# Patient Record
Sex: Female | Born: 1985 | Race: Black or African American | Hispanic: No | Marital: Single | Smoking: Never smoker
Health system: Southern US, Community
[De-identification: ages and names within clinical notes are randomized; demographics above are authoritative.]

## PROBLEM LIST (undated history)

## (undated) DIAGNOSIS — K449 Diaphragmatic hernia without obstruction or gangrene: Secondary | ICD-10-CM

---

## 2011-07-23 ENCOUNTER — Encounter (HOSPITAL_COMMUNITY): Payer: Self-pay

## 2011-07-23 ENCOUNTER — Emergency Department (HOSPITAL_COMMUNITY)
Admission: EM | Admit: 2011-07-23 | Discharge: 2011-07-24 | Disposition: A | Discharge status: Home / Self Care | Payer: Self-pay | Attending: Emergency Medicine | Admitting: Emergency Medicine

## 2011-07-23 DIAGNOSIS — L089 Local infection of the skin and subcutaneous tissue, unspecified: Secondary | ICD-10-CM | POA: Insufficient documentation

## 2011-07-23 DIAGNOSIS — R21 Rash and other nonspecific skin eruption: Secondary | ICD-10-CM | POA: Insufficient documentation

## 2011-07-23 DIAGNOSIS — R238 Other skin changes: Secondary | ICD-10-CM

## 2011-07-23 MED ORDER — OXYCODONE-ACETAMINOPHEN 5-325 MG PO TABS
1.0000 | ORAL_TABLET | Freq: Once | ORAL | Status: DC
  Filled 2011-07-23: qty 1

## 2011-07-23 NOTE — ED Notes (Signed)
States she had a rash that oozes at times to her scalp x 3 weeks-states it flakes like dandruff.  Has been using head and shoulders and other OTC treatments w/o relief.

## 2011-07-23 NOTE — ED Provider Notes (Signed)
History     CSN: [id number]  Arrival date & time 07/23/11  2055   First MD Initiated Contact with Patient 07/23/11 2300      Chief Complaint  Patient presents with  . Rash    (Consider location/radiation/quality/duration/timing/severity/associated sxs/prior treatment) HPI  26 year old female presents complaining of a rash to the scalp. States noticed a rash on the scalp the past 3 weeks. Rash is described as itchy, persistent, with flakes like dandruff.  Has also notice burning lesion that sometimes ooze when scratched.  Pt admits to occasionally using synthetic hair, weaves, but has never had this before.  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  Pt has tried different types of shampoo without relief.  Denies fever, chills, headache, neck pain, or other body rash.  No other family member has same rash.  NO hx of psoriasis.    History reviewed. No pertinent past medical history.  History reviewed. No pertinent past surgical history.  History reviewed. No pertinent family history.  History  Substance Use Topics  . Smoking status: Never Smoker   . Smokeless tobacco: Not on file  . Alcohol Use: No    OB History    Grav Para Term Preterm Abortions TAB SAB Ect Mult Living                  Review of Systems  All other systems reviewed and are negative.    Allergies  Review of patient's allergies indicates no known allergies.  Home Medications  No current outpatient prescriptions on file.  BP 118/71  Pulse 76  Temp 98.2 F (36.8 C) (Oral)  Resp 16  SpO2 97%  LMP 06/25/2011  Physical Exam  Nursing note and vitals reviewed. Constitutional: She is oriented to person, place, and time. She appears well-nourished.  HENT:  Head: Normocephalic.  Mouth/Throat: Oropharynx is clear and moist. No oropharyngeal exudate.       Hyperkeratotic skin changes to scalp throughout hairline, and extending down to lateral aspect of face.  Several vesicular lesions (kerion) noted to scalp  in various distribution.  No abscess noted.  No evidence of headlice.    Eyes: Conjunctivae are normal. Right eye exhibits no discharge. Left eye exhibits no discharge.  Neck: Neck supple.  Cardiovascular: Normal rate and regular rhythm.   Pulmonary/Chest: Effort normal. No respiratory distress. She has no wheezes.  Musculoskeletal: Normal range of motion.  Lymphadenopathy:    She has no cervical adenopathy.  Neurological: She is alert and oriented to person, place, and time.  Skin: Skin is warm. Rash noted.    ED Course  Procedures (including critical care time)  Labs Reviewed - No data to display No results found.   No diagnosis found.  1. Scalp rash 2. Secondary skin infection  MDM  Rash on scalp x 3 weeks.  Sxs is not suggestive  of tinea capitis.  Likely atopic dermatitis with superficial skin infection.  Plan to treat with clindamycin and selenium sulfide shampoo and f/u with dermatologist .  My attending has also eveluated pt.     Fayrene Helper, PA-C 07/23/11 2355

## 2011-07-24 MED ORDER — SELENIUM SULFIDE 2.25 % EX FOAM: CUTANEOUS | Status: DC

## 2011-07-24 MED ORDER — CLINDAMYCIN HCL 150 MG PO CAPS: 150.0000 mg | ORAL_CAPSULE | Freq: Four times a day (QID) | ORAL | Status: AC

## 2011-07-24 NOTE — ED Provider Notes (Signed)
Medical screening examination/treatment/procedure(s) were performed by non-physician practitioner and as supervising physician I was immediately available for consultation/collaboration.  Gerhard Munch, MD 07/24/11 2136

## 2012-01-28 ENCOUNTER — Emergency Department (HOSPITAL_BASED_OUTPATIENT_CLINIC_OR_DEPARTMENT_OTHER)
Admission: EM | Admit: 2012-01-28 | Discharge: 2012-01-28 | Disposition: A | Discharge status: Home / Self Care | Payer: Self-pay | Attending: Emergency Medicine | Admitting: Emergency Medicine

## 2012-01-28 ENCOUNTER — Encounter (HOSPITAL_BASED_OUTPATIENT_CLINIC_OR_DEPARTMENT_OTHER): Payer: Self-pay | Admitting: Emergency Medicine

## 2012-01-28 DIAGNOSIS — R05 Cough: Secondary | ICD-10-CM | POA: Insufficient documentation

## 2012-01-28 DIAGNOSIS — J111 Influenza due to unidentified influenza virus with other respiratory manifestations: Secondary | ICD-10-CM | POA: Insufficient documentation

## 2012-01-28 DIAGNOSIS — R509 Fever, unspecified: Secondary | ICD-10-CM | POA: Insufficient documentation

## 2012-01-28 DIAGNOSIS — R059 Cough, unspecified: Secondary | ICD-10-CM | POA: Insufficient documentation

## 2012-01-28 MED ORDER — IBUPROFEN 800 MG PO TABS: 800.0000 mg | ORAL_TABLET | Freq: Three times a day (TID) | ORAL | Status: DC

## 2012-01-28 MED ORDER — ONDANSETRON HCL 8 MG PO TABS
4.0000 mg | ORAL_TABLET | Freq: Once | ORAL | Status: AC
  Administered 2012-01-28: 4 mg via ORAL
  Filled 2012-01-28: qty 1

## 2012-01-28 MED ORDER — PROMETHAZINE HCL 25 MG RE SUPP: 25.0000 mg | Freq: Four times a day (QID) | RECTAL | Status: DC | PRN

## 2012-01-28 MED ORDER — GUAIFENESIN-CODEINE 100-10 MG/5ML PO SYRP: 5.0000 mL | ORAL_SOLUTION | Freq: Three times a day (TID) | ORAL | Status: DC | PRN

## 2012-01-28 MED ORDER — KETOROLAC TROMETHAMINE 60 MG/2ML IM SOLN
60.0000 mg | Freq: Once | INTRAMUSCULAR | Status: AC
  Administered 2012-01-28: 60 mg via INTRAMUSCULAR
  Filled 2012-01-28: qty 2

## 2012-01-28 NOTE — ED Notes (Signed)
Pt with cough, fever, chills, and body aches for 2 days. Left work early due to cough.

## 2012-01-28 NOTE — ED Provider Notes (Signed)
History     CSN: [id number]  Arrival date & time 01/28/12  1840   First MD Initiated Contact with Patient 01/28/12 1852      Chief Complaint  Patient presents with  . Influenza    (Consider location/radiation/quality/duration/timing/severity/associated sxs/prior treatment) HPI Comments: Patient comes to the ER with complaints of fever, chills, sinus congestion, body aches and cough for 2 days. She has had nausea and vomiting without diarrhea as well. She has been able to keep some soup today. Patient reports that she had to leave work early today due to illness, needs a note.  Patient is a 27 y.o. female presenting with flu symptoms.  Influenza    History reviewed. No pertinent past medical history.  History reviewed. No pertinent past surgical history.  No family history on file.  History  Substance Use Topics  . Smoking status: Never Smoker   . Smokeless tobacco: Not on file  . Alcohol Use: No    OB History    Grav Para Term Preterm Abortions TAB SAB Ect Mult Living                  Review of Systems  Constitutional: Positive for fever and chills.  HENT: Positive for congestion.   Respiratory: Positive for cough.   Gastrointestinal: Positive for nausea and vomiting. Negative for diarrhea.    Allergies  Review of patient's allergies indicates no known allergies.  Home Medications   Current Outpatient Rx  Name  Route  Sig  Dispense  Refill  . SELENIUM SULFIDE 2.25 % EX FOAM      Apply to scalp twice daily.  Rub into affected skin until completely absorbed.  Continues until symptoms resolved.   70 g   0     BP 101/60  Pulse 115  Temp 100.5 F (38.1 C) (Oral)  Resp 20  SpO2 95%  LMP 01/11/2012  Physical Exam  Constitutional: She is oriented to person, place, and time. She appears well-developed and well-nourished.  HENT:  Head: Normocephalic.  Nose: Mucosal edema present.  Mouth/Throat: Oropharynx is clear and moist and mucous membranes are  normal. No oropharyngeal exudate.  Eyes: Pupils are equal, round, and reactive to light.  Neck: Normal range of motion. Neck supple.  Cardiovascular: Normal rate, regular rhythm and normal heart sounds.   Pulmonary/Chest: Breath sounds normal. She is in respiratory distress. She has no wheezes. She has no rales. She exhibits no tenderness.  Abdominal: Soft. She exhibits no distension and no mass. There is no tenderness.  Musculoskeletal: Normal range of motion. She exhibits no edema.  Neurological: She is alert and oriented to person, place, and time. She has normal reflexes. No cranial nerve deficit.  Skin: Skin is warm and dry.  Psychiatric: She has a normal mood and affect.    ED Course  Procedures (including critical care time)  Labs Reviewed - No data to display No results found.   Diagnosis: Influenza    MDM  Patient presents with symptoms consistent with the prevalent influenza in the community currently. She will be treated symptomatically. Examination was unremarkable. Lungs are clear. No clinical concern for pneumonia based on auscultation. Return to the ER if symptoms worsen.        Gilda Crease, MD 01/28/12 [phone]

## 2012-11-06 ENCOUNTER — Encounter (HOSPITAL_COMMUNITY): Payer: Self-pay | Admitting: Emergency Medicine

## 2012-11-06 ENCOUNTER — Emergency Department (HOSPITAL_COMMUNITY)
Admission: EM | Admit: 2012-11-06 | Discharge: 2012-11-06 | Disposition: A | Discharge status: Home / Self Care | Payer: Self-pay | Attending: Dermatology | Admitting: Dermatology

## 2012-11-06 DIAGNOSIS — Z791 Long term (current) use of non-steroidal anti-inflammatories (NSAID): Secondary | ICD-10-CM | POA: Insufficient documentation

## 2012-11-06 DIAGNOSIS — L259 Unspecified contact dermatitis, unspecified cause: Secondary | ICD-10-CM | POA: Insufficient documentation

## 2012-11-06 MED ORDER — TRIAMCINOLONE ACETONIDE 0.1 % EX CREA: TOPICAL_CREAM | Freq: Two times a day (BID) | CUTANEOUS | Status: DC

## 2012-11-06 NOTE — ED Provider Notes (Signed)
CSN: [id number]     Arrival date & time 11/06/12  1859 History   This chart was scribed for non-physician practitioner Kyung Bacca working with No att. providers found by Joaquin Music, ED Scribe. This patient was seen in room WTR5/WTR5 and the patient's care was started at 8:09 PM .   Chief Complaint  Patient presents with  . Rash    The history is provided by the patient. No language interpreter was used.   HPI Comments: Martha Brady is a 27 y.o. female who presents to the Emergency Department complaining of ongoing worsening, severely pruritic rash on bilateral upper extremities. Pt states the rash has been worsening although she has been applying hydrocortisone. She states she scratches the rash and has noticed circular patches on her arms. Pt states the rash feels like it "burns" now. Pt denies discharge of rash. Pt denies rash being in any other location of her body. Pt denies ever having a hx of this rash. She states she has had allergic reactions before but does not believe this is an allergic reaction. Pt denies yard work, coming in contact with poison ivy, change of detergents or shampoos, and change of fragrances. Pt states she wakes up in her sleep due to the burning/itching sensation. Pt denies having pets in her house. Pt denies fever or other associated infectious sx.   History reviewed. No pertinent past medical history. History reviewed. No pertinent past surgical history. No family history on file. History  Substance Use Topics  . Smoking status: Never Smoker   . Smokeless tobacco: Not on file  . Alcohol Use: No   OB History   Grav Para Term Preterm Abortions TAB SAB Ect Mult Living                 Review of Systems  All other systems reviewed and are negative.   Allergies  Review of patient's allergies indicates no known allergies.  Home Medications   Current Outpatient Rx  Name  Route  Sig  Dispense  Refill  . guaiFENesin-codeine  (ROBITUSSIN AC) 100-10 MG/5ML syrup   Oral   Take 5 mLs by mouth 3 (three) times daily as needed for cough.   120 mL   0   . ibuprofen (ADVIL,MOTRIN) 800 MG tablet   Oral   Take 1 tablet (800 mg total) by mouth 3 (three) times daily.   21 tablet   0   . promethazine (PHENERGAN) 25 MG suppository   Rectal   Place 1 suppository (25 mg total) rectally every 6 (six) hours as needed for nausea.   12 each   0   . Selenium Sulfide 2.25 % FOAM      Apply to scalp twice daily.  Rub into affected skin until completely absorbed.  Continues until symptoms resolved.   70 g   0    Triage Vitals:BP 125/76  Pulse 72  Temp(Src) 98.6 F (37 C) (Oral)  Resp 18  SpO2 99%  LMP 10/30/2012  Physical Exam  Nursing note and vitals reviewed. Constitutional: She is oriented to person, place, and time. She appears well-developed and well-nourished. No distress.  HENT:  Head: Normocephalic and atraumatic.  Eyes: EOM are normal.  Neck: Neck supple. No tracheal deviation present.  Cardiovascular: Normal rate.   Pulmonary/Chest: Effort normal. No respiratory distress.  Musculoskeletal: Normal range of motion.  Neurological: She is alert and oriented to person, place, and time.  Skin: Skin is warm and dry.  Several discrete, 2-32mm  skin colored papules in clustered rings on bilateral forearms, both flexor and extensor surfaces, from wrists to just proximal to elbow.  Pt scratching.    Psychiatric: She has a normal mood and affect. Her behavior is normal.    ED Course  Procedures  Labs Review Labs Reviewed - No data to display Imaging Review No results found.  EKG Interpretation   None       MDM   1. Contact dermatitis    Healthy 27yo F presents w/ pruritic rash of bilateral forearms.  Has the appearance of contact dermatitis.  Irritant unknown.  She has used hydrocortisone w/out relief.  Will try a higher potency steroid.  Recommended moisturizers, gentle soap, benadryl, cool  compresses, avoidance of scratching.  Referred to primary care and derm.  8:46 PM   I personally performed the services described in this documentation, which was scribed in my presence. The recorded information has been reviewed and is accurate.    Otilio Miu, PA-C 11/06/12 2050

## 2012-11-06 NOTE — ED Notes (Addendum)
Pt c/o rash on both upper extremities that burns denies any other places.. Denies any changes in detergent or any other things that could affect arms. NKA.

## 2012-11-07 NOTE — ED Provider Notes (Signed)
Medical screening examination/treatment/procedure(s) were performed by non-physician practitioner and as supervising physician I was immediately available for consultation/collaboration.  EKG Interpretation   None         Eniyah Eastmond E Eyvette Cordon, MD 11/07/12 1244 

## 2013-12-03 ENCOUNTER — Ambulatory Visit (HOSPITAL_BASED_OUTPATIENT_CLINIC_OR_DEPARTMENT_OTHER): Payer: Self-pay

## 2013-12-03 ENCOUNTER — Ambulatory Visit: Payer: Self-pay | Attending: Internal Medicine | Admitting: Internal Medicine

## 2013-12-03 ENCOUNTER — Encounter: Payer: Self-pay | Admitting: Internal Medicine

## 2013-12-03 VITALS — BP 129/91 | HR 83 | Temp 98.0°F | Resp 16 | Wt 174.8 lb

## 2013-12-03 DIAGNOSIS — R109 Unspecified abdominal pain: Secondary | ICD-10-CM | POA: Insufficient documentation

## 2013-12-03 DIAGNOSIS — R319 Hematuria, unspecified: Secondary | ICD-10-CM | POA: Insufficient documentation

## 2013-12-03 DIAGNOSIS — Z139 Encounter for screening, unspecified: Secondary | ICD-10-CM

## 2013-12-03 DIAGNOSIS — Z23 Encounter for immunization: Secondary | ICD-10-CM | POA: Insufficient documentation

## 2013-12-03 LAB — POCT URINALYSIS DIPSTICK
BILIRUBIN UA: NEGATIVE
GLUCOSE UA: NEGATIVE
KETONES UA: NEGATIVE
Leukocytes, UA: NEGATIVE
Nitrite, UA: NEGATIVE
Protein, UA: NEGATIVE
SPEC GRAV UA: 1.02
UROBILINOGEN UA: 0.2
pH, UA: 6.5

## 2013-12-03 NOTE — Progress Notes (Signed)
Patient Demographics  Martha Brady, is a 28 y.o. female  ZOX:[id number]:[id number]  WJX:[id number]  DOB - [birth year]  CC:  Chief Complaint  Patient presents with  . Establish Care    flank pain       HPI: Martha HarrisonVeronica Wilborn is a 28 y.o. female here today to establish medical care.she reported to have left flank pain on and off for the last few weeks denies any nausea vomiting, does complain of foul smelling urine and urinary frequency, denies any dysuria, she does report a previous history of UTI, as per patient she took some ibuprofen which help her with the symptoms, today her urine shows small trace of blood as per patient her last menstrual period was 10 days ago. Patient denies any vaginal discharge. Patient has No headache, No chest pain, No abdominal pain - No Nausea, No new weakness tingling or numbness, No Cough - SOB.  No Known Allergies History reviewed. No pertinent past medical history. Current Outpatient Prescriptions on File Prior to Visit  Medication Sig Dispense Refill  . hydrocortisone cream 1 % Apply 1 application topically 2 (two) times daily.    Marland Kitchen. triamcinolone cream (KENALOG) 0.1 % Apply topically 2 (two) times daily. 30 g 0   No current facility-administered medications on file prior to visit.   History reviewed. No pertinent family history. History   Social History  . Marital Status: Single    Spouse Name: N/A    Number of Children: N/A  . Years of Education: N/A   Occupational History  . Not on file.   Social History Main Topics  . Smoking status: Never Smoker   . Smokeless tobacco: Not on file  . Alcohol Use: No  . Drug Use: No  . Sexual Activity: Not Currently    Birth Control/ Protection: Condom   Other Topics Concern  . Not on file   Social History Narrative    Review of Systems: Constitutional: Negative for fever, chills, diaphoresis, activity change, appetite change and fatigue. HENT: Negative for ear pain, nosebleeds, congestion,  facial swelling, rhinorrhea, neck pain, neck stiffness and ear discharge.  Eyes: Negative for pain, discharge, redness, itching and visual disturbance. Respiratory: Negative for cough, choking, chest tightness, shortness of breath, wheezing and stridor.  Cardiovascular: Negative for chest pain, palpitations and leg swelling. Gastrointestinal: Negative for abdominal distention. Genitourinary: Negative for dysuria, urgency, frequency, hematuria, flank pain, decreased urine volume, difficulty urinating and dyspareunia.  Musculoskeletal: Negative for back pain, joint swelling, arthralgia and gait problem. Neurological: Negative for dizziness, tremors, seizures, syncope, facial asymmetry, speech difficulty, weakness, light-headedness, numbness and headaches.  Hematological: Negative for adenopathy. Does not bruise/bleed easily. Psychiatric/Behavioral: Negative for hallucinations, behavioral problems, confusion, dysphoric mood, decreased concentration and agitation.    Objective:   Filed Vitals:   12/03/13 0910  BP: 129/91  Pulse: 83  Temp: 98 F (36.7 C)  Resp: 16    Physical Exam: Constitutional: Patient appears well-developed and well-nourished. No distress. HENT: Normocephalic, atraumatic, External right and left ear normal. Oropharynx is clear and moist.  Eyes: Conjunctivae and EOM are normal. PERRLA, no scleral icterus. Neck: Normal ROM. Neck supple. No JVD. No tracheal deviation. No thyromegaly. CVS: RRR, S1/S2 +, no murmurs, no gallops, no carotid bruit.  Pulmonary: Effort and breath sounds normal, no stridor, rhonchi, wheezes, rales.  Abdominal: Soft. BS +, no distension, tenderness, rebound or guarding. Left flank tenderness with deep palpation. Musculoskeletal: Normal range of motion. No edema and no tenderness.  Neuro: Alert. Normal  reflexes, muscle tone coordination. No cranial nerve deficit. Skin: Skin is warm and dry. No rash noted. Not diaphoretic. No erythema. No  pallor. Psychiatric: Normal mood and affect. Behavior, judgment, thought content normal.  No results found for: WBC, HGB, HCT, MCV, PLT No results found for: CREATININE, BUN, NA, K, CL, CO2  No results found for: HGBA1C Lipid Panel  No results found for: CHOL, TRIG, HDL, CHOLHDL, VLDL, LDLCALC     Assessment and plan:   1. Flank pain  -  Results for orders placed or performed in visit on 12/03/13  Urinalysis Dipstick  Result Value Ref Range   Color, UA yellow    Clarity, UA clear    Glucose, UA neg    Bilirubin, UA neg    Ketones, UA neg    Spec Grav, UA 1.020    Blood, UA small    pH, UA 6.5    Protein, UA neg    Urobilinogen, UA 0.2    Nitrite, UA neg    Leukocytes, UA Negative    Urinalysis Dipstick Is negative for infection but does show small blood. - CT RENAL STONE STUDY; Future  2. Screening Ordered baseline blood work. - COMPLETE METABOLIC PANEL WITH GFR - CBC with Differential - Vit D  25 hydroxy (rtn osteoporosis monitoring) - Hemoglobin A1c - TSH  3. Hematuria  - CT RENAL STONE STUDY; Future  4. Needs flu shot Flu shot given today.     Health Maintenance  -Vaccinations:  Flu shot given today   Return in about 3 months (around 03/05/2014), or if symptoms worsen or fail to improve.  Doris CheadleADVANI, Leonides Minder, MD

## 2013-12-03 NOTE — Progress Notes (Signed)
Patient here to establish care Complains of having a foul odor when she voids Complains of having left side flank pain for over a week

## 2013-12-05 ENCOUNTER — Ambulatory Visit (HOSPITAL_COMMUNITY): Payer: Self-pay

## 2013-12-13 ENCOUNTER — Ambulatory Visit (HOSPITAL_COMMUNITY)
Admission: RE | Admit: 2013-12-13 | Discharge: 2013-12-13 | Disposition: A | Discharge status: Home / Self Care | Payer: Medicaid Other | Source: Ambulatory Visit | Attending: Internal Medicine | Admitting: Internal Medicine

## 2013-12-13 DIAGNOSIS — R319 Hematuria, unspecified: Secondary | ICD-10-CM | POA: Insufficient documentation

## 2013-12-13 DIAGNOSIS — R109 Unspecified abdominal pain: Secondary | ICD-10-CM

## 2013-12-14 ENCOUNTER — Telehealth: Payer: Self-pay

## 2013-12-14 NOTE — Telephone Encounter (Signed)
-----   Message from Doris Cheadleeepak Advani, MD sent at 12/14/2013 12:43 PM EST ----- Call and let the patient know that her CT abdomen and pelvis reported no acute intra-abdominal or intrapelvic abnormalities, just reported small umbilical hernia containing fat. Will repeat her urine test  on the following visit if that still shows blood, we'll consider referral to urology.

## 2013-12-14 NOTE — Telephone Encounter (Signed)
Spoke with patient and she is aware of her CT results 

## 2014-04-23 ENCOUNTER — Emergency Department (HOSPITAL_COMMUNITY)
Admission: EM | Admit: 2014-04-23 | Discharge: 2014-04-23 | Disposition: A | Discharge status: Home / Self Care | Payer: Medicaid Other | Attending: Emergency Medicine | Admitting: Emergency Medicine

## 2014-04-23 ENCOUNTER — Encounter (HOSPITAL_COMMUNITY): Payer: Self-pay

## 2014-04-23 ENCOUNTER — Emergency Department (HOSPITAL_COMMUNITY): Payer: Medicaid Other

## 2014-04-23 DIAGNOSIS — J069 Acute upper respiratory infection, unspecified: Secondary | ICD-10-CM

## 2014-04-23 DIAGNOSIS — R1110 Vomiting, unspecified: Secondary | ICD-10-CM | POA: Diagnosis not present

## 2014-04-23 DIAGNOSIS — Z7952 Long term (current) use of systemic steroids: Secondary | ICD-10-CM | POA: Insufficient documentation

## 2014-04-23 DIAGNOSIS — R059 Cough, unspecified: Secondary | ICD-10-CM

## 2014-04-23 DIAGNOSIS — R05 Cough: Secondary | ICD-10-CM

## 2014-04-23 MED ORDER — HYDROCODONE-HOMATROPINE 5-1.5 MG/5ML PO SYRP: 5.0000 mL | ORAL_SOLUTION | Freq: Four times a day (QID) | ORAL | Status: DC | PRN

## 2014-04-23 MED ORDER — ALBUTEROL SULFATE HFA 108 (90 BASE) MCG/ACT IN AERS
2.0000 | INHALATION_SPRAY | RESPIRATORY_TRACT | Status: DC | PRN
  Administered 2014-04-23: 2 via RESPIRATORY_TRACT
  Filled 2014-04-23: qty 6.7

## 2014-04-23 MED ORDER — BENZONATATE 100 MG PO CAPS: 100.0000 mg | ORAL_CAPSULE | Freq: Three times a day (TID) | ORAL | Status: DC

## 2014-04-23 MED ORDER — AEROCHAMBER PLUS W/MASK MISC
1.0000 | Freq: Once | Status: AC
  Administered 2014-04-23: 1
  Filled 2014-04-23: qty 1

## 2014-04-23 MED ORDER — ONDANSETRON 8 MG PO TBDP: ORAL_TABLET | ORAL | Status: DC

## 2014-04-23 MED ORDER — ONDANSETRON 8 MG PO TBDP
8.0000 mg | ORAL_TABLET | Freq: Once | ORAL | Status: AC
  Administered 2014-04-23: 8 mg via ORAL
  Filled 2014-04-23: qty 1

## 2014-04-23 MED ORDER — GUAIFENESIN ER 600 MG PO TB12: 1200.0000 mg | ORAL_TABLET | Freq: Two times a day (BID) | ORAL | Status: DC

## 2014-04-23 MED ORDER — BENZONATATE 100 MG PO CAPS
200.0000 mg | ORAL_CAPSULE | Freq: Once | ORAL | Status: AC
  Administered 2014-04-23: 200 mg via ORAL
  Filled 2014-04-23: qty 2

## 2014-04-23 MED ORDER — ACETAMINOPHEN 500 MG PO TABS
1000.0000 mg | ORAL_TABLET | Freq: Once | ORAL | Status: AC
  Administered 2014-04-23: 1000 mg via ORAL
  Filled 2014-04-23: qty 2

## 2014-04-23 NOTE — ED Provider Notes (Signed)
CSN: [id number]     Arrival date & time 04/23/14  1638 History   First MD Initiated Contact with Patient 04/23/14 1813     Chief Complaint  Patient presents with  . Cough  . Emesis     (Consider location/radiation/quality/duration/timing/severity/associated sxs/prior Treatment) Patient is a 29 y.o. female presenting with cough and vomiting. The history is provided by the patient and medical records. No language interpreter was used.  Cough Associated symptoms: rhinorrhea, shortness of breath, sore throat and wheezing   Associated symptoms: no chest pain, no chills, no ear pain, no fever, no headaches, no myalgias and no rash   Emesis Associated symptoms: sore throat   Associated symptoms: no abdominal pain, no arthralgias, no chills, no diarrhea, no headaches and no myalgias      Martha Brady is a 29 y.o. female  With no major medical history presents to the Emergency Department complaining of gradual, persistent, progressively worsening cough onset 2 days ago. Associated symptoms include sinus congestion, rhinorrhea, sore throat, otalgia, subjective fevers. Patient reports numerous episodes of posttussive emesis. She reports that she coughs so hard she vomits. She reports it's been difficult to keep food down for long periods of time. She denies abdominal pain, diarrhea.  Patient reports no known sick contacts. She reports her childhood vaccines including pertussis are up-to-date and she did receive a flu vaccine issue.  Patient denies treatment prior to arrival. No aggravating or alleviating factors. She denies headache, neck pain, neck stiffness, chest pain, shortness of breath, abdominal pain, weakness, dizziness, syncope, dysuria.  LMP: 03/12/2014  History reviewed. No pertinent past medical history. History reviewed. No pertinent past surgical history. History reviewed. No pertinent family history. History  Substance Use Topics  . Smoking status: Never Smoker   . Smokeless tobacco:  Not on file  . Alcohol Use: No   OB History    No data available     Review of Systems  Constitutional: Positive for fatigue. Negative for fever, chills and appetite change.  HENT: Positive for congestion, postnasal drip, rhinorrhea, sinus pressure and sore throat. Negative for ear discharge, ear pain and mouth sores.   Eyes: Negative for visual disturbance.  Respiratory: Positive for cough, chest tightness, shortness of breath and wheezing. Negative for stridor.   Cardiovascular: Negative for chest pain, palpitations and leg swelling.  Gastrointestinal: Positive for vomiting ( Posttussive). Negative for nausea, abdominal pain and diarrhea.  Genitourinary: Negative for dysuria, urgency, frequency and hematuria.  Musculoskeletal: Negative for myalgias, back pain, arthralgias and neck stiffness.  Skin: Negative for rash.  Neurological: Negative for syncope, light-headedness, numbness and headaches.  Hematological: Negative for adenopathy.  Psychiatric/Behavioral: The patient is not nervous/anxious.   All other systems reviewed and are negative.     Allergies  Review of patient's allergies indicates no known allergies.  Home Medications   Prior to Admission medications   Medication Sig Start Date End Date Taking? Authorizing Provider  acetaminophen (TYLENOL) 500 MG tablet Take 500 mg by mouth every 6 (six) hours as needed for mild pain or headache.   Yes Historical Provider, MD  dextromethorphan (DELSYM) 30 MG/5ML liquid Take 30 mg by mouth as needed for cough.   Yes Historical Provider, MD  benzonatate (TESSALON) 100 MG capsule Take 1 capsule (100 mg total) by mouth every 8 (eight) hours. 04/23/14   Makensey Rego, PA-C  guaiFENesin (MUCINEX) 600 MG 12 hr tablet Take 2 tablets (1,200 mg total) by mouth 2 (two) times daily. 04/23/14   Dahlia Client Daegen Berrocal,  PA-C  HYDROcodone-homatropine (HYCODAN) 5-1.5 MG/5ML syrup Take 5 mLs by mouth every 6 (six) hours as needed for cough. 04/23/14    Rohaan Durnil, PA-C  ondansetron (ZOFRAN ODT) 8 MG disintegrating tablet  ODT q4 hours prn nausea 04/23/14   Dahlia Client Justin Meisenheimer, PA-C  triamcinolone cream (KENALOG) 0.1 % Apply topically 2 (two) times daily. Patient not taking: Reported on 04/23/2014 11/06/12   Ruby Cola, PA-C   BP 116/86 mmHg  Pulse 79  Temp(Src) 99.5 F (37.5 C) (Oral)  Resp 18  SpO2 100%  LMP 03/12/2014 Physical Exam  Constitutional: She is oriented to person, place, and time. She appears well-developed and well-nourished. No distress.  Awake, alert, nontoxic appearance  HENT:  Head: Normocephalic and atraumatic.  Right Ear: Tympanic membrane, external ear and ear canal normal.  Left Ear: Tympanic membrane, external ear and ear canal normal.  Nose: Mucosal edema and rhinorrhea present. No epistaxis. Right sinus exhibits no maxillary sinus tenderness and no frontal sinus tenderness. Left sinus exhibits no maxillary sinus tenderness and no frontal sinus tenderness.  Mouth/Throat: Uvula is midline and mucous membranes are normal. Mucous membranes are not pale and not cyanotic. Posterior oropharyngeal erythema present. No oropharyngeal exudate, posterior oropharyngeal edema or tonsillar abscesses.  Posterior oropharyngeal erythema and irritation without edema or exudate  Eyes: Conjunctivae are normal. Pupils are equal, round, and reactive to light. No scleral icterus.  Neck: Normal range of motion and full passive range of motion without pain. Neck supple.  Cardiovascular: Normal rate, regular rhythm and intact distal pulses.   Pulmonary/Chest: Effort normal and breath sounds normal. No stridor. No respiratory distress. She has no wheezes.  Clear and equal breath sounds without focal wheezes, rhonchi, rales  Abdominal: Soft. Bowel sounds are normal. She exhibits no distension and no mass. There is no tenderness. There is no rebound, no guarding and no CVA tenderness.  Abdomen is soft and nontender  without rebound or guarding No CVA tenderness  Musculoskeletal: Normal range of motion. She exhibits no edema.  Lymphadenopathy:    She has no cervical adenopathy.  Neurological: She is alert and oriented to person, place, and time.  Speech is clear and goal oriented Moves extremities without ataxia  Skin: Skin is warm and dry. No rash noted. She is not diaphoretic.  Psychiatric: She has a normal mood and affect.  Nursing note and vitals reviewed.   ED Course  Procedures (including critical care time) Labs Review Labs Reviewed - No data to display  Imaging Review Dg Chest 2 View  04/23/2014   CLINICAL DATA:  Cough for 2 days.  EXAM: CHEST  2 VIEW  COMPARISON:  None.  FINDINGS: The heart size and mediastinal contours are within normal limits. Both lungs are clear. The visualized skeletal structures are unremarkable.  IMPRESSION: No active cardiopulmonary disease.   Electronically Signed   By: Charlett Nose M.D.   On: 04/23/2014 17:59     EKG Interpretation None      MDM   Final diagnoses:  Cough  Post-tussive emesis  Viral URI   Martha Brady presents with URI symptoms and post tussive emesis. Low-grade fever here in the emergency department.  Pt CXR negative for acute infiltrate. Patients symptoms are consistent with URI, likely viral etiology. Patient given Zofran and Tessalon here in the emergency department along with acetaminophen. She reports she is feeling better and was able to tolerate a small amount of fluid.   Patient's abdomen remained soft and nontender on exam. Doubt pancreatitis, appendicitis,  diverticulitis, ectopic pregnancy.  Discussed that antibiotics are not indicated for viral infections. Pt will be discharged with symptomatic treatment.  She has been given prescriptions for both Hycodan and Tessalon. Discussed the cost difference of the 2 and will allow her to check her insurance and make a choice. Verbalizes understanding and is agreeable with plan. Pt is  hemodynamically stable & in NAD prior to dc.  I have personally reviewed patient's vitals, nursing note and any pertinent labs or imaging.  I performed an undressed physical exam.    It has been determined that no acute conditions requiring further emergency intervention are present at this time. The patient/guardian have been advised of the diagnosis and plan. I reviewed all labs and imaging including any potential incidental findings. We have discussed signs and symptoms that warrant return to the ED and they are listed in the discharge instructions.    Vital signs are stable at discharge.   BP 116/86 mmHg  Pulse 79  Temp(Src) 99.5 F (37.5 C) (Oral)  Resp 18  SpO2 100%  LMP 03/12/2014         Dierdre ForthHannah Jaquin Coy, PA-C 04/23/14 1946  Benjiman CoreNathan Pickering, MD 04/24/14 [phone]

## 2014-04-23 NOTE — ED Notes (Signed)
Per pt, cough x 2 days. Pt has also started having emesis during cough.  99.4 temp at home.  Sore throat.

## 2014-04-23 NOTE — Discharge Instructions (Signed)
1. Medications: albuterol, mucinex, tessalon or hycodan (whichever is better for you), usual home medications 2. Treatment: rest, drink plenty of fluids, take tylenol or ibuprofen for fever control 3. Follow Up: Please followup with your primary doctor in 3 days for discussion of your diagnoses and further evaluation after today's visit; if you do not have a primary care doctor use the resource guide provided to find one; Return to the ER for high fevers, difficulty breathing or other concerning symptoms    Cough, Adult  A cough is a reflex that helps clear your throat and airways. It can help heal the body or may be a reaction to an irritated airway. A cough may only last 2 or 3 weeks (acute) or may last more than 8 weeks (chronic).  CAUSES Acute cough:  Viral or bacterial infections. Chronic cough:  Infections.  Allergies.  Asthma.  Post-nasal drip.  Smoking.  Heartburn or acid reflux.  Some medicines.  Chronic lung problems (COPD).  Cancer. SYMPTOMS   Cough.  Fever.  Chest pain.  Increased breathing rate.  High-pitched whistling sound when breathing (wheezing).  Colored mucus that you cough up (sputum). TREATMENT   A bacterial cough may be treated with antibiotic medicine.  A viral cough must run its course and will not respond to antibiotics.  Your caregiver may recommend other treatments if you have a chronic cough. HOME CARE INSTRUCTIONS   Only take over-the-counter or prescription medicines for pain, discomfort, or fever as directed by your caregiver. Use cough suppressants only as directed by your caregiver.  Use a cold steam vaporizer or humidifier in your bedroom or home to help loosen secretions.  Sleep in a semi-upright position if your cough is worse at night.  Rest as needed.  Stop smoking if you smoke. SEEK IMMEDIATE MEDICAL CARE IF:   You have pus in your sputum.  Your cough starts to worsen.  You cannot control your cough with  suppressants and are losing sleep.  You begin coughing up blood.  You have difficulty breathing.  You develop pain which is getting worse or is uncontrolled with medicine.  You have a fever. MAKE SURE YOU:   Understand these instructions.  Will watch your condition.  Will get help right away if you are not doing well or get worse. Document Released: 06/26/2010 Document Revised: 03/22/2011 Document Reviewed: 06/26/2010 [facility] Patient Information 2015 [employer] [place]. This information is not intended to replace advice given to you by your health care provider. Make sure you discuss any questions you have with your health care provider.

## 2014-04-24 MED ORDER — HYDROCODONE-HOMATROPINE 5-1.5 MG/5ML PO SYRP: 5.0000 mL | ORAL_SOLUTION | Freq: Four times a day (QID) | ORAL | Status: DC | PRN

## 2014-08-15 ENCOUNTER — Encounter (HOSPITAL_COMMUNITY): Payer: Self-pay | Admitting: *Deleted

## 2014-08-15 ENCOUNTER — Emergency Department (HOSPITAL_COMMUNITY)
Admission: EM | Admit: 2014-08-15 | Discharge: 2014-08-15 | Disposition: A | Discharge status: Home / Self Care | Payer: Medicaid Other | Attending: Emergency Medicine | Admitting: Emergency Medicine

## 2014-08-15 ENCOUNTER — Emergency Department (HOSPITAL_COMMUNITY): Payer: Medicaid Other

## 2014-08-15 DIAGNOSIS — R079 Chest pain, unspecified: Secondary | ICD-10-CM | POA: Diagnosis present

## 2014-08-15 DIAGNOSIS — Z79899 Other long term (current) drug therapy: Secondary | ICD-10-CM | POA: Diagnosis not present

## 2014-08-15 LAB — CBC
HCT: 34.6 % — ABNORMAL LOW (ref 36.0–46.0)
Hemoglobin: 11.6 g/dL — ABNORMAL LOW (ref 12.0–15.0)
MCH: 25.4 pg — ABNORMAL LOW (ref 26.0–34.0)
MCHC: 33.5 g/dL (ref 30.0–36.0)
MCV: 75.7 fL — ABNORMAL LOW (ref 78.0–100.0)
Platelets: 311 10*3/uL (ref 150–400)
RBC: 4.57 MIL/uL (ref 3.87–5.11)
RDW: 13.6 % (ref 11.5–15.5)
WBC: 8.8 10*3/uL (ref 4.0–10.5)

## 2014-08-15 LAB — BASIC METABOLIC PANEL
Anion gap: 10 (ref 5–15)
BUN: 6 mg/dL (ref 6–20)
CO2: 22 mmol/L (ref 22–32)
Calcium: 9.2 mg/dL (ref 8.9–10.3)
Chloride: 105 mmol/L (ref 101–111)
Creatinine, Ser: 0.69 mg/dL (ref 0.44–1.00)
GFR calc Af Amer: >60 mL/min (ref >60)
GFR calc non Af Amer: >60 mL/min (ref >60)
Glucose, Bld: 93 mg/dL (ref 65–99)
Potassium: 3.8 mmol/L (ref 3.5–5.1)
Sodium: 137 mmol/L (ref 135–145)

## 2014-08-15 LAB — I-STAT TROPONIN, ED: Troponin i, poc: 0.01 ng/mL (ref 0.00–0.08)

## 2014-08-15 NOTE — ED Notes (Signed)
Pt states that she began having intermittent chest pain for several days. Pt denies any worsening factors or associated symptoms.

## 2014-08-15 NOTE — ED Provider Notes (Signed)
CSN: [id number]     Arrival date & time 08/15/14  1404 History   First MD Initiated Contact with Patient 08/15/14 1757     Chief Complaint  Patient presents with  . Chest Pain     (Consider location/radiation/quality/duration/timing/severity/associated sxs/prior Treatment) HPI   29 year old female with chest pain.. Sometimes dull and occasionally sharp. Has been going on for 3-4 days. No appreciable exacerbating relieving factors. No respiratory complaints. No fevers or chills. No unusual leg pain or swelling. No history similar type pain. Otherwise fairly healthy. Nonsmoker.  History reviewed. No pertinent past medical history. History reviewed. No pertinent past surgical history. No family history on file. History  Substance Use Topics  . Smoking status: Never Smoker   . Smokeless tobacco: Not on file  . Alcohol Use: No   OB History    No data available     Review of Systems  All systems reviewed and negative, other than as noted in HPI.   Allergies  Review of patient's allergies indicates no known allergies.  Home Medications   Prior to Admission medications   Medication Sig Start Date End Date Taking? Authorizing Provider  acetaminophen (TYLENOL) 500 MG tablet Take 500 mg by mouth every 6 (six) hours as needed for mild pain or headache.    Historical Provider, MD  benzonatate (TESSALON) 100 MG capsule Take 1 capsule (100 mg total) by mouth every 8 (eight) hours. 04/23/14   Hannah Muthersbaugh, PA-C  dextromethorphan (DELSYM) 30 MG/5ML liquid Take 30 mg by mouth as needed for cough.    Historical Provider, MD  guaiFENesin (MUCINEX) 600 MG 12 hr tablet Take 2 tablets (1,200 mg total) by mouth 2 (two) times daily. 04/23/14   Hannah Muthersbaugh, PA-C  HYDROcodone-homatropine (HYCODAN) 5-1.5 MG/5ML syrup Take 5 mLs by mouth every 6 (six) hours as needed for cough. 04/23/14   Hannah Muthersbaugh, PA-C  HYDROcodone-homatropine (HYCODAN) 5-1.5 MG/5ML syrup Take 5 mLs by mouth  every 6 (six) hours as needed for cough. 04/24/14   Tiffany Neva Seat, PA-C  ondansetron (ZOFRAN ODT) 8 MG disintegrating tablet 8mg  ODT q4 hours prn nausea 04/23/14   Dahlia Client Muthersbaugh, PA-C  triamcinolone cream (KENALOG) 0.1 % Apply topically 2 (two) times daily. Patient not taking: Reported on 04/23/2014 11/06/12   Ruby Cola, PA-C   BP 122/72 mmHg  Pulse 97  Temp(Src) 98 F (36.7 C) (Oral)  Resp 16  SpO2 100%  LMP 07/25/2014 Physical Exam  Constitutional: She appears well-developed and well-nourished. No distress.  HENT:  Head: Normocephalic and atraumatic.  Eyes: Conjunctivae are normal. Right eye exhibits no discharge. Left eye exhibits no discharge.  Neck: Neck supple.  Cardiovascular: Normal rate, regular rhythm and normal heart sounds.  Exam reveals no gallop and no friction rub.   No murmur heard. Pulmonary/Chest: Effort normal and breath sounds normal. No respiratory distress.  Abdominal: Soft. She exhibits no distension. There is no tenderness.  Musculoskeletal: She exhibits no edema or tenderness.  Lower extremities symmetric as compared to each other. No calf tenderness. Negative Homan's. No palpable cords.   Neurological: She is alert.  Skin: Skin is warm and dry.  Psychiatric: She has a normal mood and affect. Her behavior is normal. Thought content normal.  Nursing note and vitals reviewed.   ED Course  Procedures (including critical care time) Labs Review Labs Reviewed  CBC - Abnormal; Notable for the following:    Hemoglobin 11.6 (*)    HCT 34.6 (*)    MCV 75.7 (*)  MCH 25.4 (*)    All other components within normal limits  BASIC METABOLIC PANEL  I-STAT TROPOININ, ED    Imaging Review Dg Chest 2 View  08/15/2014   CLINICAL DATA:  Sudden onset chest pain  EXAM: CHEST  2 VIEW  COMPARISON:  04/23/2014  FINDINGS: Normal heart size and mediastinal contours. No acute infiltrate or edema. No effusion or pneumothorax. No acute osseous findings.   IMPRESSION: Normal chest.   Electronically Signed   By: Marnee Spring M.D.   On: 08/15/2014 14:47     EKG Interpretation   Date/Time:  Thursday August 15 2014 14:12:36 EDT Ventricular Rate:  96 PR Interval:  140 QRS Duration: 70 QT Interval:  354 QTC Calculation: 447 R Axis:   51 Text Interpretation:  Sinus rhythm with occasional Premature ventricular  complexes Nonspecific T wave abnormality Abnormal ECG No old tracing to  compare Confirmed by Auther Lyerly  MD, Cosme Jacob ([id number]) on 08/15/2014 6:35:48 PM      MDM   Final diagnoses:  Chest pain, unspecified chest pain type    28yF with CP. Atypical for ACS. Doubt PE, serious infection, dissection or other emergent process. Afebrile. Well appearing. Lungs clear. No hypoxemia or increased WOB. CXR clear. EKG w/o overt ischemic changes. W/u and exam overall unremarkable. It has been determined that no acute conditions requiring further emergency intervention are present at this time. The patient has been advised of the diagnosis and plan. I reviewed any labs and imaging including any potential incidental findings. We have discussed signs and symptoms that warrant return to the ED and they are listed in the discharge instructions.     Raeford Razor, MD 08/18/14 [phone]

## 2014-08-15 NOTE — Discharge Instructions (Signed)

## 2014-09-25 ENCOUNTER — Emergency Department (HOSPITAL_COMMUNITY)
Admission: EM | Admit: 2014-09-25 | Discharge: 2014-09-25 | Disposition: A | Discharge status: Home / Self Care | Payer: Medicaid Other | Source: Home / Self Care | Attending: Emergency Medicine | Admitting: Emergency Medicine

## 2014-09-25 ENCOUNTER — Encounter (HOSPITAL_COMMUNITY): Payer: Self-pay | Admitting: Emergency Medicine

## 2014-09-25 DIAGNOSIS — J039 Acute tonsillitis, unspecified: Secondary | ICD-10-CM

## 2014-09-25 LAB — POCT RAPID STREP A: Streptococcus, Group A Screen (Direct): NEGATIVE

## 2014-09-25 MED ORDER — CLINDAMYCIN HCL 300 MG PO CAPS: 300.0000 mg | ORAL_CAPSULE | Freq: Three times a day (TID) | ORAL | Status: DC

## 2014-09-25 NOTE — ED Notes (Signed)
The patient presented to the [facility] with a complaint of a sore throat that has been occurring for 4 days. The patient stated that she has also been having a lot of mucus. She stated that she tried Mucinex with no relief.

## 2014-09-25 NOTE — Discharge Instructions (Signed)
You have an infection of your tonsils. Take clindamycin 3 times a day for 10 days. Continue Tylenol or ibuprofen as needed for fever. You can do saltwater gargles, tea with honey, or Chloraseptic spray to help soothe your throat. Follow-up as needed.

## 2014-09-25 NOTE — ED Provider Notes (Signed)
CSN: [id number]     Arrival date & time 09/25/14  1928 History   First MD Initiated Contact with Patient 09/25/14 1943     Chief Complaint  Patient presents with  . Sore Throat   (Consider location/radiation/quality/duration/timing/severity/associated sxs/prior Treatment) HPI  She is a 29 year old woman here for evaluation of sore throat. This started about 3 days ago. It is worse with swallowing. She reports a mild cough. She reports feeling a lot of mucus in her throat. She has tried Mucinex and Delsym without much improvement. She reports a low-grade fever of around the 100. She has been taking Tylenol with improvement of fever. No nausea, vomiting, diarrhea. No nasal congestion or rhinorrhea.  History reviewed. No pertinent past medical history. History reviewed. No pertinent past surgical history. History reviewed. No pertinent family history. Social History  Substance Use Topics  . Smoking status: Never Smoker   . Smokeless tobacco: None  . Alcohol Use: No   OB History    No data available     Review of Systems As in history of present illness Allergies  Review of patient's allergies indicates no known allergies.  Home Medications   Prior to Admission medications   Medication Sig Start Date End Date Taking? Authorizing Provider  acetaminophen (TYLENOL) 500 MG tablet Take 500 mg by mouth every 6 (six) hours as needed for mild pain or headache.   Yes Historical Provider, MD  guaiFENesin (MUCINEX) 600 MG 12 hr tablet Take 2 tablets (1,200 mg total) by mouth 2 (two) times daily. 04/23/14  Yes Hannah Muthersbaugh, PA-C  clindamycin (CLEOCIN) 300 MG capsule Take 1 capsule (300 mg total) by mouth 3 (three) times daily. 09/25/14   Charm Rings, MD  ondansetron (ZOFRAN ODT) 8 MG disintegrating tablet  ODT q4 hours prn nausea 04/23/14   Dahlia Client Muthersbaugh, PA-C   Meds Ordered and Administered this Visit  Medications - No data to display  BP 115/88 mmHg  Pulse 94  Temp(Src)  98.4 F (36.9 C) (Oral)  Resp 18  SpO2 97%  LMP 09/25/2014 (Exact Date) No data found.   Physical Exam  Constitutional: She is oriented to person, place, and time. She appears well-developed and well-nourished. No distress.  HENT:  Nose: Nose normal.  Mouth/Throat: Oropharyngeal exudate present.  Neck: Neck supple.  Cardiovascular: Normal rate, regular rhythm and normal heart sounds.   No murmur heard. Pulmonary/Chest: Effort normal and breath sounds normal. No respiratory distress. She has no wheezes. She has no rales.  Lymphadenopathy:    She has no cervical adenopathy.  Neurological: She is alert and oriented to person, place, and time.    ED Course  Procedures (including critical care time)  Labs Review Labs Reviewed  POCT RAPID STREP A    Imaging Review No results found.    MDM   1. Exudative tonsillitis    Rapid strep negative. Throat culture sent. We'll treat with clindamycin to cover atypical bacterial tonsillitis. Follow-up as needed.    Charm Rings, MD 09/25/14 2006

## 2014-09-27 LAB — CULTURE, GROUP A STREP

## 2014-09-30 NOTE — ED Notes (Signed)
No changes to plan of care needed per Dr Piedad Climes

## 2016-06-08 ENCOUNTER — Encounter (HOSPITAL_COMMUNITY): Payer: Self-pay | Admitting: Emergency Medicine

## 2016-06-08 ENCOUNTER — Emergency Department (HOSPITAL_COMMUNITY)
Admission: EM | Admit: 2016-06-08 | Discharge: 2016-06-08 | Disposition: A | Discharge status: Home / Self Care | Payer: Medicaid Other | Attending: Emergency Medicine | Admitting: Emergency Medicine

## 2016-06-08 DIAGNOSIS — R21 Rash and other nonspecific skin eruption: Secondary | ICD-10-CM | POA: Diagnosis present

## 2016-06-08 DIAGNOSIS — L42 Pityriasis rosea: Secondary | ICD-10-CM | POA: Insufficient documentation

## 2016-06-08 DIAGNOSIS — Z79899 Other long term (current) drug therapy: Secondary | ICD-10-CM | POA: Insufficient documentation

## 2016-06-08 MED ORDER — PREDNISONE 50 MG PO TABS: ORAL_TABLET | ORAL | 0 refills | Status: DC

## 2016-06-08 MED ORDER — FAMOTIDINE 20 MG PO TABS: 20.0000 mg | ORAL_TABLET | Freq: Two times a day (BID) | ORAL | 0 refills | Status: DC

## 2016-06-08 MED ORDER — HYDROXYZINE HCL 25 MG PO TABS: 25.0000 mg | ORAL_TABLET | Freq: Four times a day (QID) | ORAL | 0 refills | Status: DC

## 2016-06-08 NOTE — ED Provider Notes (Signed)
[facility] Provider Note   CSN: [id number] Arrival date & time: 06/08/16  2006  By signing my name below, I, Martha Brady, attest that this documentation has been prepared under the direction and in the presence of non-physician practitioner, Kerrie BuffaloHope Zamorah Ailes, NP. Electronically Signed: Rosana Fretana Brady, ED Scribe. 06/08/16. 8:51 PM.  History   Chief Complaint Chief Complaint  Patient presents with  . Rash   The history is provided by the patient. No language interpreter was used.  Rash   This is a new problem. The current episode started more than 2 days ago. The problem has been gradually worsening. The problem is associated with nothing. There has been no fever. The rash is present on the trunk, right arm, left arm and neck. Associated symptoms include itching. She has tried antihistamines and anti-itch cream for the symptoms. The treatment provided no relief.   HPI Comments: Martha Brady is a 31 y.o. female who presents to the Emergency Department complaining of a moderate, gradually worsening area of ichiness to the upper body onset 5 days ago. She reports her rash started on her chest and spread to her bilateral arms and neck. Pt states her symptoms are exacerbated at night. Pt has tried hydrocortisone, Benadryl, and Zyrtec with no relief of her symptoms. No use of new detergents, soaps or lotions. Pt denies nausea, vomiting, sore throat, throat swelling, SOB or any other complaints at this time.  History reviewed. No pertinent past medical history.  Patient Active Problem List   Diagnosis Date Noted  . Flank pain 12/03/2013  . Hematuria 12/03/2013    History reviewed. No pertinent surgical history.  OB History    No data available       Home Medications    Prior to Admission medications   Medication Sig Start Date End Date Taking? Authorizing Provider  acetaminophen (TYLENOL) 500 MG tablet Take 500 mg by mouth every 6 (six) hours as needed for mild pain or  headache.    [provider]  clindamycin (CLEOCIN) 300 MG capsule Take 1 capsule (300 mg total) by mouth 3 (three) times daily. 09/25/14   Charm RingsHonig, Erin J, MD  famotidine (PEPCID) 20 MG tablet Take 1 tablet (20 mg total) by mouth 2 (two) times daily. 06/08/16   Janne NapoleonNeese, Dameisha Tschida M, NP  guaiFENesin (MUCINEX) 600 MG 12 hr tablet Take 2 tablets (1,200 mg total) by mouth 2 (two) times daily. 04/23/14   Muthersbaugh, Dahlia ClientHannah, PA-C  hydrOXYzine (ATARAX/VISTARIL) 25 MG tablet Take 1 tablet (25 mg total) by mouth every 6 (six) hours. 06/08/16   Janne NapoleonNeese, Evrett Hakim M, NP  ondansetron (ZOFRAN ODT) 8 MG disintegrating tablet 8mg  ODT q4 hours prn nausea 04/23/14   Muthersbaugh, Dahlia ClientHannah, PA-C  predniSONE (DELTASONE) 50 MG tablet Take one tablet PO daily 06/08/16   Janne NapoleonNeese, Iretha Kirley M, NP    Family History No family history on file.  Social History Social History  Substance Use Topics  . Smoking status: Never Smoker  . Smokeless tobacco: Never Used  . Alcohol use No     Allergies   Patient has no known allergies.   Review of Systems Review of Systems  HENT: Negative for sore throat.   Respiratory: Negative for shortness of breath.   Gastrointestinal: Negative for nausea and vomiting.  Musculoskeletal: Negative for joint swelling.  Skin: Positive for itching and rash.     Physical Exam Updated Vital Signs BP (!) 124/93 (BP Location: Left Arm)   Pulse 95   Temp 98.1 F (36.7  C) (Oral)   Resp 16   Ht 5\' 5"  (1.651 m)   Wt 83.9 kg (185 lb)   LMP 05/25/2016 (Exact Date)   SpO2 99%   BMI 30.79 kg/m   Physical Exam  Constitutional: She is oriented to person, place, and time. She appears well-developed and well-nourished. No distress.  HENT:  Head: Normocephalic and atraumatic.  Eyes: EOM are normal.  Neck: Neck supple.  Cardiovascular: Normal rate and regular rhythm.   Pulmonary/Chest: Effort normal and breath sounds normal.  Neurological: She is alert and oriented to person, place, and time.  Skin:  Skin is warm and dry. Rash noted.  Raised, red, scaly patches to the chest, back and upper bilateral arms.   Psychiatric: She has a normal mood and affect. Her behavior is normal.  Nursing note and vitals reviewed.    ED Treatments / Results  DIAGNOSTIC STUDIES: Oxygen Saturation is 99% on RA, normal by my interpretation.   COORDINATION OF CARE: 8:48 PM-Discussed next steps with pt. Pt verbalized understanding and is agreeable with the plan.   Labs (all labs ordered are listed, but only abnormal results are displayed) Labs Reviewed - No data to display   Radiology No results found.  Procedures Procedures (including critical care time)  Medications Ordered in ED Medications - No data to display   Initial Impression / Assessment and Plan / ED Course  I have reviewed the triage vital signs and the nursing notes.  Patient with viral appearing rash. Discussed that this is self-limited.  No signs of secondary infection. Discharged with symptomatic treatment. Follow up with Dermatology in 2-3 days. Return precautions discussed. Pt is safe for discharge at this time.   Final Clinical Impressions(s) / ED Diagnoses   Final diagnoses:  Rash  Pityriasis rosea    New Prescriptions New Prescriptions   FAMOTIDINE (PEPCID) 20 MG TABLET    Take 1 tablet (20 mg total) by mouth 2 (two) times daily.   HYDROXYZINE (ATARAX/VISTARIL) 25 MG TABLET    Take 1 tablet (25 mg total) by mouth every 6 (six) hours.   PREDNISONE (DELTASONE) 50 MG TABLET    Take one tablet PO daily   I personally performed the services described in this documentation, which was scribed in my presence. The recorded information has been reviewed and is accurate.     Kerrie Buffalo [place], [place] 06/08/16 2109    Shaune Pollack, MD 06/09/16 1325

## 2016-06-08 NOTE — ED Triage Notes (Signed)
Pt c/o rash over entire body for approx 5 days.  Pt c/o itching.  No resp distress present

## 2016-06-08 NOTE — Discharge Instructions (Signed)
This is most likely a viral rash. We are treating your symptoms. If you are not improving, follow up with Dr. Margo AyeHall. Return here as needed.

## 2018-01-13 ENCOUNTER — Ambulatory Visit: Payer: Self-pay

## 2018-01-18 ENCOUNTER — Ambulatory Visit (HOSPITAL_COMMUNITY)
Admission: EM | Admit: 2018-01-18 | Discharge: 2018-01-18 | Disposition: A | Discharge status: Home / Self Care | Payer: Self-pay | Attending: Internal Medicine | Admitting: Internal Medicine

## 2018-01-18 ENCOUNTER — Encounter (HOSPITAL_COMMUNITY): Payer: Self-pay | Admitting: Emergency Medicine

## 2018-01-18 DIAGNOSIS — J069 Acute upper respiratory infection, unspecified: Secondary | ICD-10-CM

## 2018-01-18 DIAGNOSIS — R05 Cough: Secondary | ICD-10-CM

## 2018-01-18 DIAGNOSIS — B9789 Other viral agents as the cause of diseases classified elsewhere: Secondary | ICD-10-CM

## 2018-01-18 NOTE — ED Provider Notes (Signed)
[facility]    CSN: [id number] Arrival date & time: 01/18/18  1736     History   Chief Complaint Chief Complaint  Patient presents with  . URI    HPI Martha Brady is a 33 y.o. female.   33 yo female with no chronic medical problems presents to urgent care complaining of runny nose, cough and sore throat x3 days.  The patient denies fever or SOB.  Cough is non-productive     History reviewed. No pertinent past medical history.  Patient Active Problem List   Diagnosis Date Noted  . Flank pain 12/03/2013  . Hematuria 12/03/2013    History reviewed. No pertinent surgical history.  OB History   No obstetric history on file.      Home Medications    Prior to Admission medications   Medication Sig Start Date End Date Taking? Authorizing Provider  acetaminophen (TYLENOL) 500 MG tablet Take 500 mg by mouth every 6 (six) hours as needed for mild pain or headache.    [provider]  clindamycin (CLEOCIN) 300 MG capsule Take 1 capsule (300 mg total) by mouth 3 (three) times daily. Patient not taking: Reported on 01/18/2018 09/25/14   Charm Rings, MD  famotidine (PEPCID) 20 MG tablet Take 1 tablet (20 mg total) by mouth 2 (two) times daily. Patient not taking: Reported on 01/18/2018 06/08/16   Janne Napoleon, NP  guaiFENesin (MUCINEX) 600 MG 12 hr tablet Take 2 tablets (1,200 mg total) by mouth 2 (two) times daily. Patient not taking: Reported on 01/18/2018 04/23/14   Muthersbaugh, Dahlia Client, PA-C  hydrOXYzine (ATARAX/VISTARIL) 25 MG tablet Take 1 tablet (25 mg total) by mouth every 6 (six) hours. Patient not taking: Reported on 01/18/2018 06/08/16   Janne Napoleon, NP  ondansetron [facility] ODT) 8 MG disintegrating tablet 8mg  ODT q4 hours prn nausea Patient not taking: Reported on 01/18/2018 04/23/14   Muthersbaugh, Dahlia Client, PA-C  predniSONE (DELTASONE) 50 MG tablet Take one tablet PO daily Patient not taking: Reported on 01/18/2018 06/08/16   Janne Napoleon, NP    Family  History No family history on file.  Social History Social History   Tobacco Use  . Smoking status: Never Smoker  . Smokeless tobacco: Never Used  Substance Use Topics  . Alcohol use: No    Alcohol/week: 0.0 standard drinks  . Drug use: No     Allergies   Patient has no known allergies.   Review of Systems Review of Systems  Constitutional: Negative for chills and fever.  HENT: Negative for sore throat and tinnitus.   Eyes: Negative for redness.  Respiratory: Positive for cough. Negative for shortness of breath.   Cardiovascular: Negative for chest pain and palpitations.  Gastrointestinal: Negative for abdominal pain, diarrhea, nausea and vomiting.  Genitourinary: Negative for dysuria, frequency and urgency.  Musculoskeletal: Negative for myalgias.  Skin: Negative for rash.       No lesions  Neurological: Negative for weakness.  Hematological: Does not bruise/bleed easily.  Psychiatric/Behavioral: Negative for suicidal ideas.     Physical Exam Triage Vital Signs ED Triage Vitals [01/18/18 1833]  Enc Vitals Group     BP 111/82     Pulse Rate 94     Resp 18     Temp 98.1 F (36.7 C)     Temp src      SpO2 100 %     Weight      Height      Head Circumference  Peak Flow      Pain Score 6     Pain Loc      Pain Edu?      Excl. in GC?    No data found.  Updated Vital Signs BP 111/82   Pulse 94   Temp 98.1 F (36.7 C)   Resp 18   SpO2 100%   Visual Acuity Right Eye Distance:   Left Eye Distance:   Bilateral Distance:    Right Eye Near:   Left Eye Near:    Bilateral Near:     Physical Exam Vitals signs and nursing note reviewed.  Constitutional:      General: She is not in acute distress.    Appearance: She is well-developed.  HENT:     Head: Normocephalic and atraumatic.  Eyes:     General: No scleral icterus.    Conjunctiva/sclera: Conjunctivae normal.     Pupils: Pupils are equal, round, and reactive to light.  Neck:      Musculoskeletal: Normal range of motion and neck supple.     Thyroid: No thyromegaly.     Vascular: No JVD.     Trachea: No tracheal deviation.  Cardiovascular:     Rate and Rhythm: Normal rate and regular rhythm.     Heart sounds: Normal heart sounds. No murmur. No friction rub. No gallop.   Pulmonary:     Effort: Pulmonary effort is normal.     Breath sounds: Normal breath sounds.  Abdominal:     General: Bowel sounds are normal. There is no distension.     Palpations: Abdomen is soft.     Tenderness: There is no abdominal tenderness.  Musculoskeletal: Normal range of motion.  Lymphadenopathy:     Cervical: No cervical adenopathy.  Skin:    General: Skin is warm and dry.  Neurological:     Mental Status: She is alert and oriented to person, place, and time.     Cranial Nerves: No cranial nerve deficit.  Psychiatric:        Behavior: Behavior normal.        Thought Content: Thought content normal.        Judgment: Judgment normal.      UC Treatments / Results  Labs (all labs ordered are listed, but only abnormal results are displayed) Labs Reviewed - No data to display  EKG None  Radiology No results found.  Procedures Procedures (including critical care time)  Medications Ordered in UC Medications - No data to display  Initial Impression / Assessment and Plan / UC Course  I have reviewed the triage vital signs and the nursing notes.  Pertinent labs & imaging results that were available during my care of the patient were reviewed by me and considered in my medical decision making (see chart for details).     Viral URI w/ cough.  Supportive care.    Final Clinical Impressions(s) / UC Diagnoses   Final diagnoses:  Viral URI with cough   Discharge Instructions   None    ED Prescriptions    None     Controlled Substance Prescriptions [place] Controlled Substance Registry consulted? Not Applicable   Arnaldo Natal, MD 01/18/18 1850

## 2018-01-18 NOTE — ED Triage Notes (Signed)
Pt c/o cough, runny nose, sore throat since Monday morning.

## 2018-11-29 ENCOUNTER — Emergency Department (HOSPITAL_COMMUNITY): Payer: Self-pay

## 2018-11-29 ENCOUNTER — Encounter (HOSPITAL_COMMUNITY): Payer: Self-pay

## 2018-11-29 ENCOUNTER — Emergency Department (HOSPITAL_COMMUNITY)
Admission: EM | Admit: 2018-11-29 | Discharge: 2018-11-29 | Disposition: A | Discharge status: Home / Self Care | Payer: Self-pay | Attending: Emergency Medicine | Admitting: Emergency Medicine

## 2018-11-29 ENCOUNTER — Other Ambulatory Visit: Payer: Self-pay

## 2018-11-29 DIAGNOSIS — R1013 Epigastric pain: Secondary | ICD-10-CM | POA: Insufficient documentation

## 2018-11-29 DIAGNOSIS — R112 Nausea with vomiting, unspecified: Secondary | ICD-10-CM | POA: Insufficient documentation

## 2018-11-29 HISTORY — DX: Diaphragmatic hernia without obstruction or gangrene: K44.9

## 2018-11-29 LAB — URINALYSIS, ROUTINE W REFLEX MICROSCOPIC
Bilirubin Urine: NEGATIVE
Glucose, UA: NEGATIVE mg/dL
Hgb urine dipstick: NEGATIVE
Ketones, ur: NEGATIVE mg/dL
Leukocytes,Ua: NEGATIVE
Nitrite: NEGATIVE
Protein, ur: NEGATIVE mg/dL
Specific Gravity, Urine: 1.008 (ref 1.005–1.030)
pH: 7 (ref 5.0–8.0)

## 2018-11-29 LAB — CBC WITH DIFFERENTIAL/PLATELET
Abs Immature Granulocytes: 0.05 10*3/uL (ref 0.00–0.07)
Basophils Absolute: 0 10*3/uL (ref 0.0–0.1)
Basophils Relative: 0 %
Eosinophils Absolute: 0.2 10*3/uL (ref 0.0–0.5)
Eosinophils Relative: 2 %
HCT: 37.5 % (ref 36.0–46.0)
Hemoglobin: 12.3 g/dL (ref 12.0–15.0)
Immature Granulocytes: 1 %
Lymphocytes Relative: 34 %
Lymphs Abs: 3.4 10*3/uL (ref 0.7–4.0)
MCH: 25.7 pg — ABNORMAL LOW (ref 26.0–34.0)
MCHC: 32.8 g/dL (ref 30.0–36.0)
MCV: 78.3 fL — ABNORMAL LOW (ref 80.0–100.0)
Monocytes Absolute: 0.6 10*3/uL (ref 0.1–1.0)
Monocytes Relative: 6 %
Neutro Abs: 5.9 10*3/uL (ref 1.7–7.7)
Neutrophils Relative %: 57 %
Platelets: 354 10*3/uL (ref 150–400)
RBC: 4.79 MIL/uL (ref 3.87–5.11)
RDW: 13.9 % (ref 11.5–15.5)
WBC: 10.1 10*3/uL (ref 4.0–10.5)
nRBC: 0 % (ref 0.0–0.2)

## 2018-11-29 LAB — COMPREHENSIVE METABOLIC PANEL
ALT: 16 U/L (ref 0–44)
AST: 18 U/L (ref 15–41)
Albumin: 4.3 g/dL (ref 3.5–5.0)
Alkaline Phosphatase: 100 U/L (ref 38–126)
Anion gap: 9 (ref 5–15)
BUN: 11 mg/dL (ref 6–20)
CO2: 24 mmol/L (ref 22–32)
Calcium: 9 mg/dL (ref 8.9–10.3)
Chloride: 104 mmol/L (ref 98–111)
Creatinine, Ser: 0.66 mg/dL (ref 0.44–1.00)
GFR calc Af Amer: >60 mL/min (ref >60)
GFR calc non Af Amer: >60 mL/min (ref >60)
Glucose, Bld: 110 mg/dL — ABNORMAL HIGH (ref 70–99)
Potassium: 3.5 mmol/L (ref 3.5–5.1)
Sodium: 137 mmol/L (ref 135–145)
Total Bilirubin: 0.7 mg/dL (ref 0.3–1.2)
Total Protein: 7.8 g/dL (ref 6.5–8.1)

## 2018-11-29 LAB — LIPASE, BLOOD: Lipase: 24 U/L (ref 11–51)

## 2018-11-29 LAB — I-STAT BETA HCG BLOOD, ED (MC, WL, AP ONLY): I-stat hCG, quantitative: <5 m[IU]/mL (ref <5)

## 2018-11-29 MED ORDER — SODIUM CHLORIDE 0.9% FLUSH: 3.0000 mL | Freq: Once | INTRAVENOUS | Status: DC

## 2018-11-29 MED ORDER — SODIUM CHLORIDE (PF) 0.9 % IJ SOLN
INTRAMUSCULAR | Status: AC
  Administered 2018-11-29: 20:00:00
  Filled 2018-11-29: qty 50

## 2018-11-29 MED ORDER — LIDOCAINE VISCOUS HCL 2 % MT SOLN
15.0000 mL | Freq: Once | OROMUCOSAL | Status: AC
Start: 2018-11-29 — End: 2018-11-29
  Administered 2018-11-29: 21:00:00 15 mL via ORAL
  Filled 2018-11-29: qty 15

## 2018-11-29 MED ORDER — ONDANSETRON HCL 4 MG/2ML IJ SOLN: 4.0000 mg | Freq: Once | INTRAMUSCULAR | Status: DC

## 2018-11-29 MED ORDER — ONDANSETRON HCL 4 MG/2ML IJ SOLN
4.0000 mg | Freq: Once | INTRAMUSCULAR | Status: AC
  Administered 2018-11-29: 4 mg via INTRAVENOUS
  Filled 2018-11-29: qty 2

## 2018-11-29 MED ORDER — IOHEXOL 300 MG/ML  SOLN
100.0000 mL | Freq: Once | INTRAMUSCULAR | Status: AC | PRN
  Administered 2018-11-29: 20:00:00 100 mL via INTRAVENOUS

## 2018-11-29 MED ORDER — ALUM & MAG HYDROXIDE-SIMETH 200-200-20 MG/5ML PO SUSP
30.0000 mL | Freq: Once | ORAL | Status: AC
Start: 2018-11-29 — End: 2018-11-29
  Administered 2018-11-29: 30 mL via ORAL
  Filled 2018-11-29: qty 30

## 2018-11-29 MED ORDER — SODIUM CHLORIDE 0.9 % IV BOLUS
1000.0000 mL | Freq: Once | INTRAVENOUS | Status: AC
  Administered 2018-11-29: 20:00:00 1000 mL via INTRAVENOUS

## 2018-11-29 MED ORDER — PROMETHAZINE HCL 25 MG/ML IJ SOLN
12.5000 mg | Freq: Once | INTRAMUSCULAR | Status: AC
  Administered 2018-11-29: 12.5 mg via INTRAVENOUS
  Filled 2018-11-29: qty 1

## 2018-11-29 MED ORDER — SUCRALFATE 1 G PO TABS: 1.0000 g | ORAL_TABLET | Freq: Three times a day (TID) | ORAL | 0 refills | Status: DC

## 2018-11-29 MED ORDER — ONDANSETRON 4 MG PO TBDP: 4.0000 mg | ORAL_TABLET | Freq: Three times a day (TID) | ORAL | 0 refills | Status: DC | PRN

## 2018-11-29 MED ORDER — FAMOTIDINE 20 MG PO TABS: 20.0000 mg | ORAL_TABLET | Freq: Two times a day (BID) | ORAL | 0 refills | Status: DC

## 2018-11-29 MED ORDER — HYDROMORPHONE HCL 1 MG/ML IJ SOLN
1.0000 mg | Freq: Once | INTRAMUSCULAR | Status: AC
  Administered 2018-11-29: 20:00:00 1 mg via INTRAVENOUS
  Filled 2018-11-29: qty 1

## 2018-11-29 NOTE — ED Provider Notes (Signed)
[facility]-EMERGENCY DEPT Provider Note   CSN: [id number] Arrival date & time: 11/29/18  1612     History   Chief Complaint Chief Complaint  Patient presents with  . Abdominal Pain    HPI Manon Banbury is a 33 y.o. female with history of hiatal hernia presents for evaluation of acute onset, intermittent severe epigastric abdominal pain since midnight last night.  Reports that she had gotten off work 1 hour prior was sitting at home when she developed severe aching in the epigastrium.  Pain worsens with certain movements.  Denies shortness of breath or chest pain.  She had one episode of nonbloody nonbilious emesis yesterday and reports persistent nausea. Also feels bloated. Reports that for the last 2 or 3 weeks she will need to use the bathroom to have a bowel movement within 30 minutes of eating a meal.  Denies urinary symptoms, vaginal itching, bleeding, discharge, fevers.  Has taken over-the-counter acid reflux medications without relief of symptoms.     The history is provided by the patient.    Past Medical History:  Diagnosis Date  . Hiatal hernia     Patient Active Problem List   Diagnosis Date Noted  . Flank pain 12/03/2013  . Hematuria 12/03/2013    History reviewed. No pertinent surgical history.   OB History   No obstetric history on file.      Home Medications    Prior to Admission medications   Medication Sig Start Date End Date Taking? Authorizing Provider  acetaminophen (TYLENOL) 500 MG tablet Take 500 mg by mouth every 6 (six) hours as needed for mild pain or headache.   Yes [provider]  clindamycin (CLEOCIN) 300 MG capsule Take 1 capsule (300 mg total) by mouth 3 (three) times daily. Patient not taking: Reported on 01/18/2018 09/25/14   Charm Rings, MD  famotidine (PEPCID) 20 MG tablet Take 1 tablet (20 mg total) by mouth 2 (two) times daily. 11/29/18   Shanaye Rief A, PA-C  guaiFENesin (MUCINEX) 600 MG 12 hr  tablet Take 2 tablets (1,200 mg total) by mouth 2 (two) times daily. Patient not taking: Reported on 01/18/2018 04/23/14   Muthersbaugh, Dahlia Client, PA-C  hydrOXYzine (ATARAX/VISTARIL) 25 MG tablet Take 1 tablet (25 mg total) by mouth every 6 (six) hours. Patient not taking: Reported on 01/18/2018 06/08/16   Janne Napoleon, NP  ondansetron (ZOFRAN ODT) 4 MG disintegrating tablet Take 1 tablet (4 mg total) by mouth every 8 (eight) hours as needed. 11/29/18   Luevenia Maxin, Terald Jump A, PA-C  predniSONE (DELTASONE) 50 MG tablet Take one tablet PO daily Patient not taking: Reported on 01/18/2018 06/08/16   Janne Napoleon, NP  sucralfate (CARAFATE) 1 g tablet Take 1 tablet (1 g total) by mouth 4 (four) times daily -  with meals and at bedtime. 11/29/18   Jeanie Sewer, PA-C    Family History History reviewed. No pertinent family history.  Social History Social History   Tobacco Use  . Smoking status: Never Smoker  . Smokeless tobacco: Never Used  Substance Use Topics  . Alcohol use: No    Alcohol/week: 0.0 standard drinks  . Drug use: No     Allergies   Patient has no known allergies.   Review of Systems Review of Systems  Constitutional: Negative for chills and fever.  Respiratory: Negative for shortness of breath.   Cardiovascular: Negative for chest pain.  Gastrointestinal: Positive for abdominal pain, nausea and vomiting. Negative for constipation  and diarrhea.  Genitourinary: Negative for dysuria, frequency, hematuria and urgency.  All other systems reviewed and are negative.    Physical Exam Updated Vital Signs BP (!) 143/105 (BP Location: Left Arm)   Pulse 80   Temp 98.1 F (36.7 C) (Oral)   Resp 18   Ht 5\' 5"  (1.651 m)   Wt 81.6 kg   LMP 08/30/2018 Comment: neg preg test  SpO2 100%   BMI 29.95 kg/m   Physical Exam Vitals signs and nursing note reviewed.  Constitutional:      General: She is not in acute distress.    Appearance: She is well-developed.  HENT:     Head:  Normocephalic and atraumatic.  Eyes:     General:        Right eye: No discharge.        Left eye: No discharge.     Conjunctiva/sclera: Conjunctivae normal.  Neck:     Vascular: No JVD.     Trachea: No tracheal deviation.  Cardiovascular:     Rate and Rhythm: Normal rate and regular rhythm.     Heart sounds: Normal heart sounds.  Pulmonary:     Effort: Pulmonary effort is normal.     Breath sounds: Normal breath sounds.  Abdominal:     General: Abdomen is protuberant. There is no distension.     Tenderness: There is abdominal tenderness in the right upper quadrant, right lower quadrant, epigastric area and periumbilical area. There is no right CVA tenderness, left CVA tenderness, guarding or rebound. Negative signs include Murphy's sign, Rovsing's sign and McBurney's sign.  Skin:    General: Skin is warm and dry.     Findings: No erythema.  Neurological:     Mental Status: She is alert.  Psychiatric:        Behavior: Behavior normal.      ED Treatments / Results  Labs (all labs ordered are listed, but only abnormal results are displayed) Labs Reviewed  COMPREHENSIVE METABOLIC PANEL - Abnormal; Notable for the following components:      Result Value   Glucose, Bld 110 (*)    All other components within normal limits  CBC WITH DIFFERENTIAL/PLATELET - Abnormal; Notable for the following components:   MCV 78.3 (*)    MCH 25.7 (*)    All other components within normal limits  LIPASE, BLOOD  URINALYSIS, ROUTINE W REFLEX MICROSCOPIC  I-STAT BETA HCG BLOOD, ED ([facility], [facility], [facility] ONLY)    EKG None  Radiology Ct Abdomen Pelvis W Contrast  Result Date: 11/29/2018 CLINICAL DATA:  Right upper quadrant pain, cholecystitis suspected. EXAM: CT ABDOMEN AND PELVIS WITH CONTRAST TECHNIQUE: Multidetector CT imaging of the abdomen and pelvis was performed using the standard protocol following bolus administration of intravenous contrast. CONTRAST:  100mL OMNIPAQUE IOHEXOL 300 MG/ML  SOLN  COMPARISON:  CT 12/13/2013 FINDINGS: Lower chest: Basilar atelectatic changes. Normal heart size. No pericardial effusion. Hepatobiliary: No focal liver abnormality is seen. No gallstones, gallbladder wall thickening, or biliary dilatation. Pancreas: Unremarkable. No pancreatic ductal dilatation or surrounding inflammatory changes. Spleen: Normal in size without focal abnormality. Adrenals/Urinary Tract: Normal adrenal glands. 6 mm hypoattenuating focus in the lower pole left kidney too small to fully characterize on CT imaging but statistically likely benign. No concerning renal lesions. No urolithiasis or hydronephrosis. Normal urinary bladder. Stomach/Bowel: Distal esophagus, stomach and duodenal sweep are unremarkable. No small bowel wall thickening or dilatation. No evidence of obstruction. A normal appendix is visualized. No colonic dilatation or  wall thickening. Minimal sigmoid diverticulosis. No evidence of acute diverticulitis. Vascular/Lymphatic: Atherosclerotic plaque within the normal caliber aorta. No suspicious or enlarged lymph nodes in the included lymphatic chains. Reproductive: Normal appearance of the uterus and adnexal structures. Other: No abdominopelvic free fluid or free gas. No bowel containing hernias. Small fat containing umbilical hernia. Musculoskeletal: No acute osseous abnormality or suspicious osseous lesion. IMPRESSION: No acute intra-abdominal process. No explanation for the patient's symptoms. Specifically, gallbladder is unremarkable by CT evaluation. Sigmoid colonic diverticulosis without evidence of acute diverticulitis. Small fat containing umbilical hernia. Electronically Signed   By: Kreg Shropshire M.D.   On: 11/29/2018 20:28    Procedures Procedures (including critical care time)  Medications Ordered in ED Medications  sodium chloride flush (NS) 0.9 % injection 3 mL (0 mLs Intravenous Hold 11/29/18 1902)  HYDROmorphone (DILAUDID) injection 1 mg (1 mg Intravenous Given  11/29/18 1931)  ondansetron (ZOFRAN) injection 4 mg (4 mg Intravenous Given 11/29/18 1931)  sodium chloride 0.9 % bolus 1,000 mL (0 mLs Intravenous Stopped 11/29/18 2305)  iohexol (OMNIPAQUE) 300 MG/ML solution 100 mL (100 mLs Intravenous Contrast Given 11/29/18 2005)  sodium chloride (PF) 0.9 % injection (  Given by Other 11/29/18 2005)  alum & mag hydroxide-simeth (MAALOX/MYLANTA) 200-200-20 MG/5ML suspension 30 mL (30 mLs Oral Given 11/29/18 2116)    And  lidocaine (XYLOCAINE) 2 % viscous mouth solution 15 mL (15 mLs Oral Given 11/29/18 2116)  promethazine (PHENERGAN) injection 12.5 mg (12.5 mg Intravenous Given 11/29/18 2204)     Initial Impression / Assessment and Plan / ED Course  I have reviewed the triage vital signs and the nursing notes.  Pertinent labs & imaging results that were available during my care of the patient were reviewed by me and considered in my medical decision making (see chart for details).        Patient presenting for evaluation of sudden onset epigastric abdominal pain with associated nausea and vomiting.  She is afebrile, vital signs are stable.  She is nontoxic in appearance.  No peritoneal signs on examination of the abdomen.  She is focal epigastric abdominal pain, some mild right upper quadrant abdominal pain as well.  Will obtain blood work and CT scan to rule out acute surgical abdominal pathology.  Lab work reviewed by me shows no leukocytosis, no anemia, no metabolic derangements, no renal insufficiency.  Lipase and LFTs within normal limits.  UA does not suggest UTI or nephrolithiasis.  Pregnancy test was negative.  CT scan shows no evidence of acute surgical abdominal pathology.  She does have evidence of sigmoid colonic diverticulosis but no evidence of acute diverticulitis.  On reevaluation patient resting comfortably in no apparent distress.  She received IV fluids, antiemetics, pain management in the ED with significant improvement.  On  reevaluation she is resting comfortably, drinking ginger ale and eating graham crackers.  Serial abdominal examinations are benign.  She is tolerating p.o. food and fluids.  Reports that nausea and pain have entirely resolved.  Suspect symptoms could be due to peptic ulcer disease or GERD.  Discussed dietary modifications, will discharge home with course of Zofran, Pepcid, Carafate.  Recommend follow-up with PCP or gastroenterology on an outpatient basis for reevaluation of symptoms.  Discussed strict ED return precautions. Patient verbalized understanding of and agreement with plan and is safe for discharge home at this time.  No complaints prior to discharge home.   Final Clinical Impressions(s) / ED Diagnoses   Final diagnoses:  Epigastric pain  Nausea  and vomiting in adult patient    ED Discharge Orders         Ordered    ondansetron (ZOFRAN ODT) 4 MG disintegrating tablet  Every 8 hours PRN     11/29/18 2255    famotidine (PEPCID) 20 MG tablet  2 times daily     11/29/18 2255    sucralfate (CARAFATE) 1 g tablet  3 times daily with meals & bedtime     11/29/18 2255           Renita Papa, PA-C 11/29/18 2338    Lucrezia Starch, MD 11/30/18 [id number]

## 2018-11-29 NOTE — ED Notes (Signed)
Patient provided gingerale and water. Patient nauseated and dry heaving after taking Maalox. EDP made aware.

## 2018-11-29 NOTE — Discharge Instructions (Signed)
1. Medications: Take Zofran as needed for nausea.  Let this medicine dissolve under the tongue and wait around 10-20 minutes before eating or drinking after taking this medication.  Start taking Pepcid twice daily with meals for the next 2 weeks, Carafate 3 times a day with meals and at night for 1 week. 2. Treatment: rest, drink plenty of fluids, advance diet slowly.  Avoid foods that can upset your stomach such as fried foods, acidic foods, spicy foods, or alcohol. 3. Follow Up: Please followup with your primary doctor or gastroenterology in 3-7 days for discussion of your diagnoses and further evaluation after today's visit; if you do not have a primary care doctor use the resource guide provided to find one; Please return to the ER for persistent vomiting, high fevers or worsening symptoms

## 2018-11-29 NOTE — ED Triage Notes (Signed)
Pt reports severe intermittent central abdominal pain and indigestion since last night. Pt reports taking OTC medications without relief. Reports one episode nausea and vomiting yesterday. Pt denies diarrhea.

## 2020-05-08 IMAGING — CT CT ABD-PELV W/ CM
2 of 4 series · 16 of 46 positions shown, 18 images · IV contrast (omnipaque)
Comparison: CT 12/13/2013

CLINICAL DATA: Right upper quadrant pain, cholecystitis suspected.

EXAM:
CT ABDOMEN AND PELVIS WITH CONTRAST
TECHNIQUE: Multidetector CT imaging of the abdomen and pelvis was performed
using the standard protocol following bolus administration of
intravenous contrast.
CONTRAST:  100mL OMNIPAQUE IOHEXOL 300 MG/ML  SOLN

[Series 3: axial st · axial · 0.70mm/px · z∈[+1165,+1545]mm · 13 of 86 slices shown, 15 images]
[im 5/86  soft-tissue]
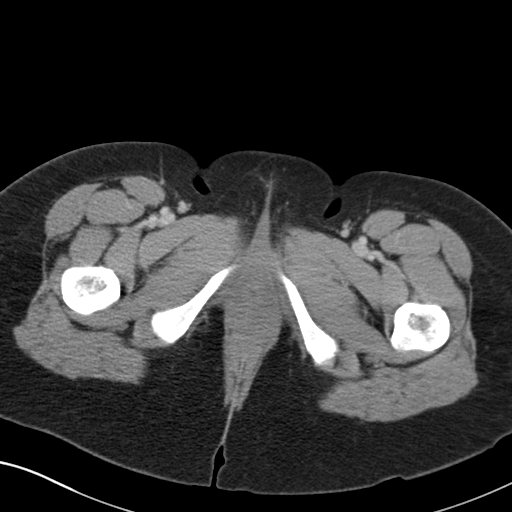
[im 5/86  bone]
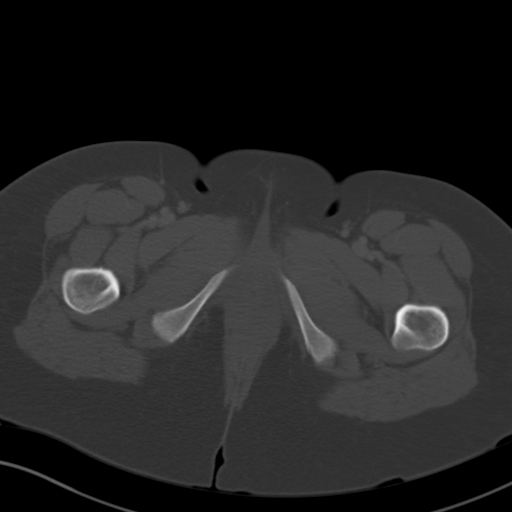
[im 10/86  soft-tissue]
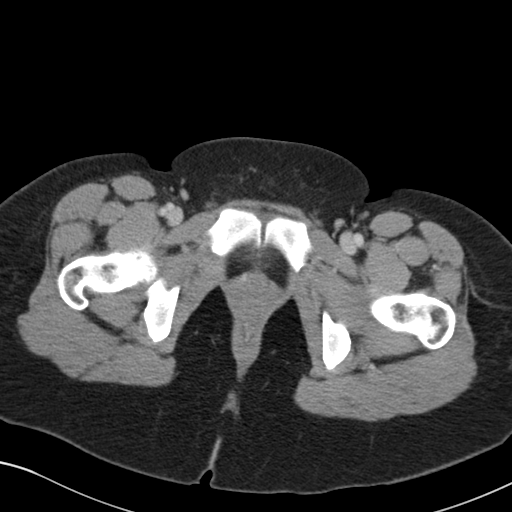
[im 19/86  soft-tissue]
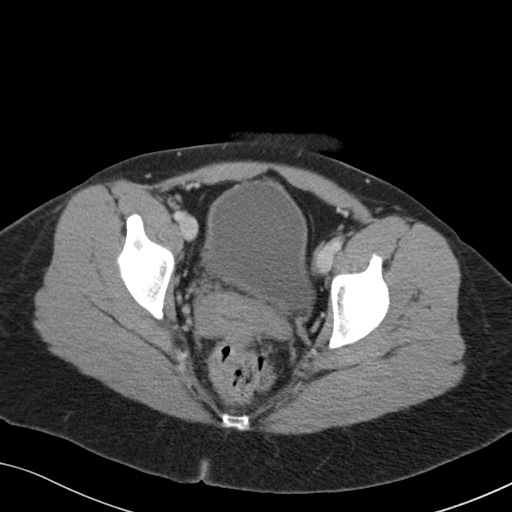
[im 24/86  soft-tissue]
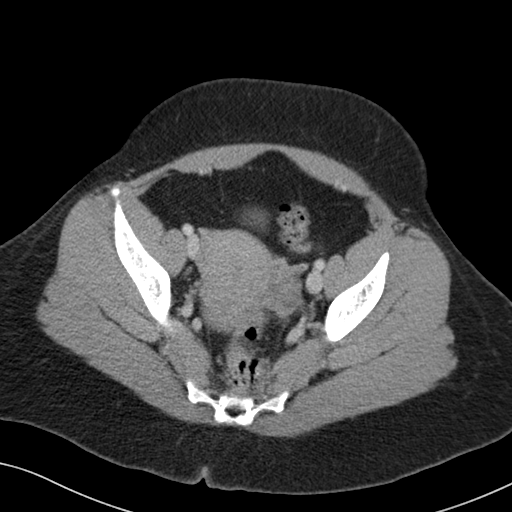
[im 29/86  soft-tissue]
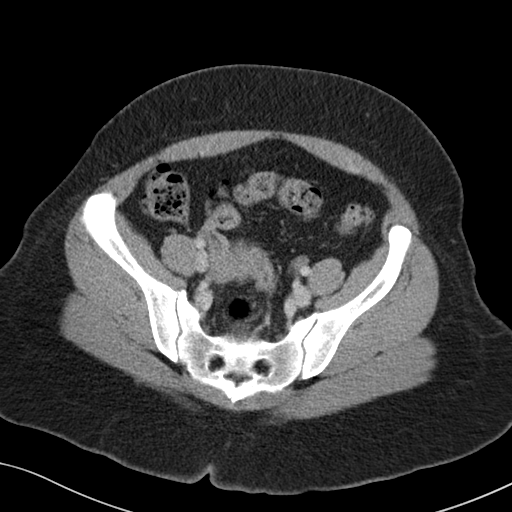
[im 38/86  soft-tissue]
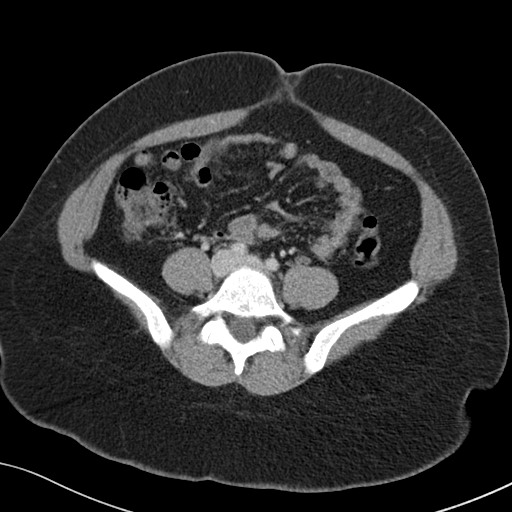
[im 43/86  soft-tissue]
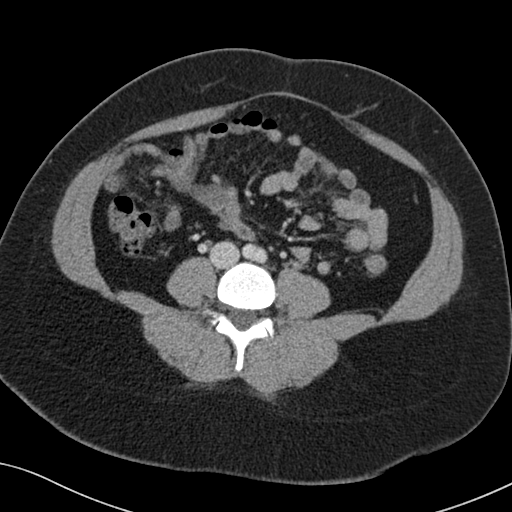
[im 48/86  soft-tissue]
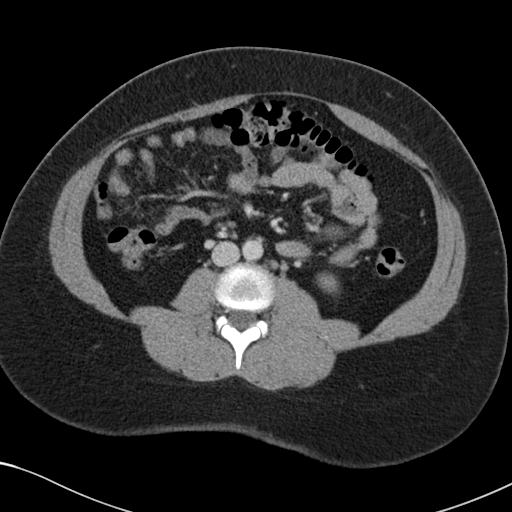
[im 57/86  soft-tissue]
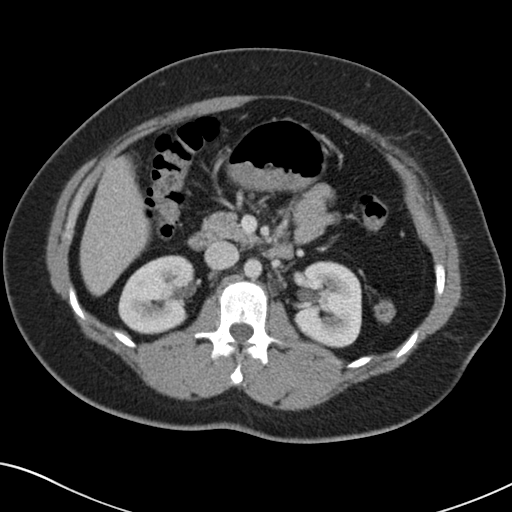
[im 57/86  bone]
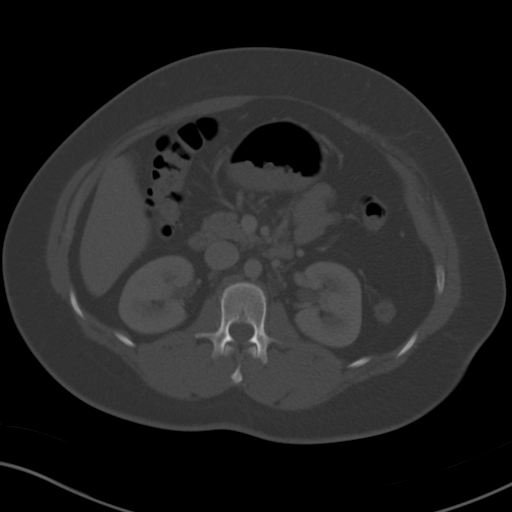
[im 62/86  soft-tissue]
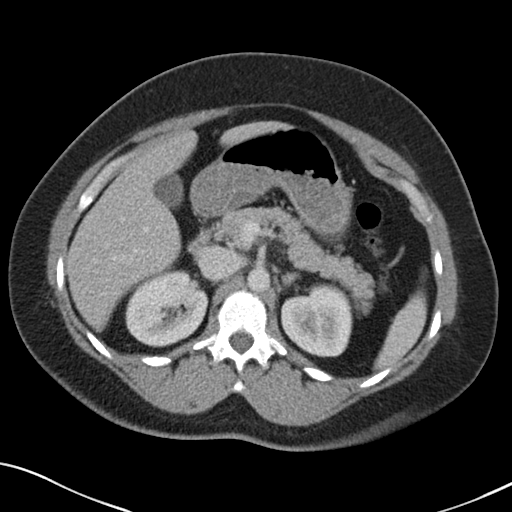
[im 67/86  soft-tissue]
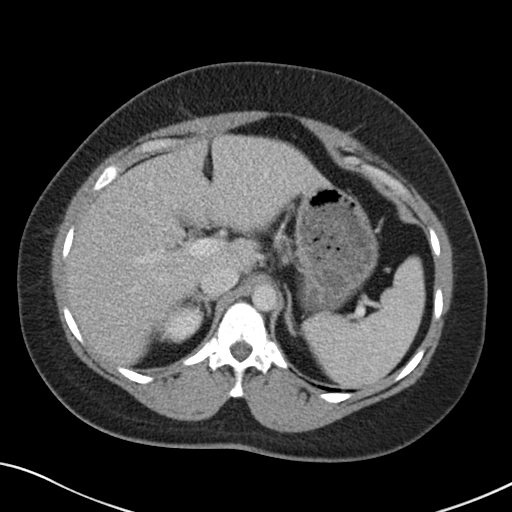
[im 76/86  soft-tissue]
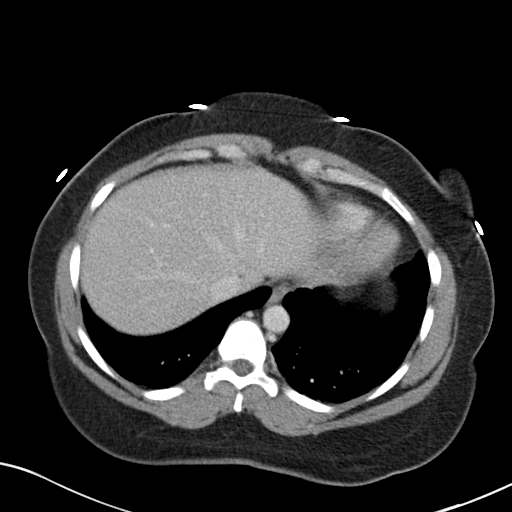
[im 81/86  soft-tissue]
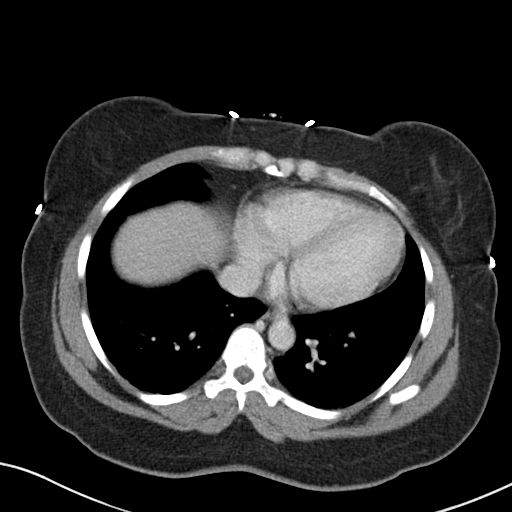

[Series 4: coronal st · coronal · 0.70mm/px · 3 of 153 slices shown]
[im 51/153  soft-tissue]
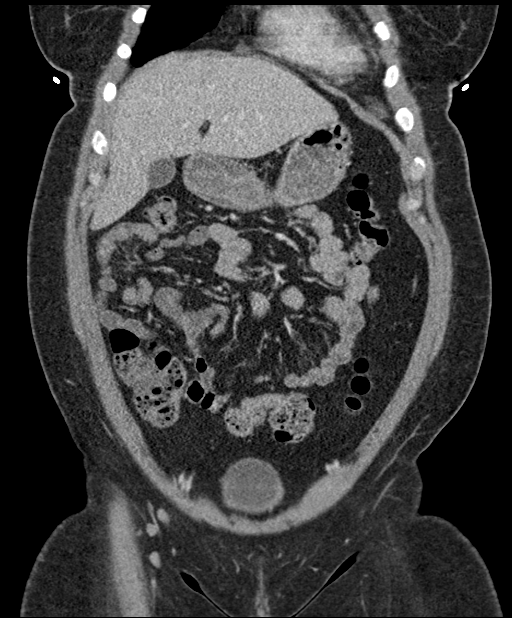
[im 68/153  soft-tissue]
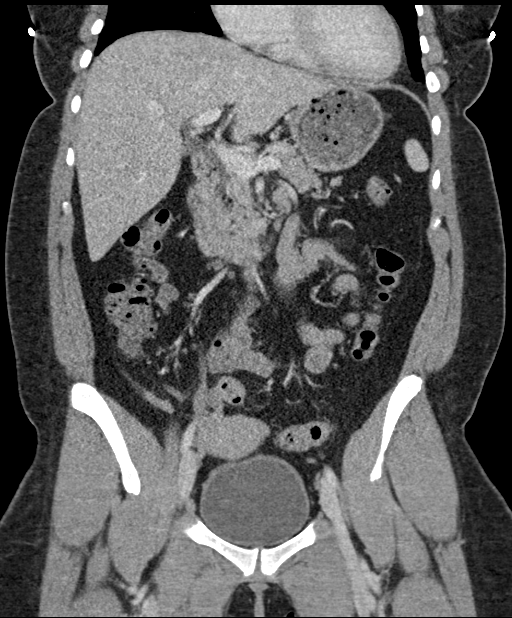
[im 85/153  soft-tissue]
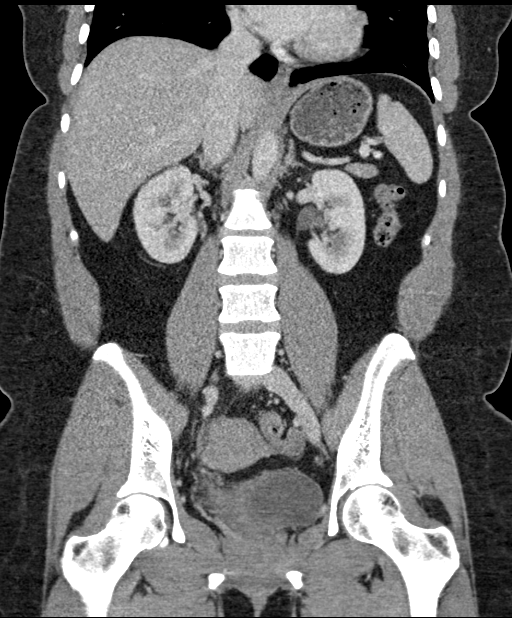

[16 of 46 positions shown; findings below may reference images not displayed]

FINDINGS: Lower chest: Basilar atelectatic changes. Normal heart size. No
pericardial effusion.

Hepatobiliary: No focal liver abnormality is seen. No gallstones,
gallbladder wall thickening, or biliary dilatation.

Pancreas: Unremarkable. No pancreatic ductal dilatation or
surrounding inflammatory changes.

Spleen: Normal in size without focal abnormality.

Adrenals/Urinary Tract: Normal adrenal glands. 6 mm hypoattenuating
focus in the lower pole left kidney too small to fully characterize
on CT imaging but statistically likely benign. No concerning renal
lesions. No urolithiasis or hydronephrosis. Normal urinary bladder.

Stomach/Bowel: Distal esophagus, stomach and duodenal sweep are
unremarkable. No small bowel wall thickening or dilatation. No
evidence of obstruction. A normal appendix is visualized. No colonic
dilatation or wall thickening. Minimal sigmoid diverticulosis. No
evidence of acute diverticulitis.

Vascular/Lymphatic: Atherosclerotic plaque within the normal caliber
aorta. No suspicious or enlarged lymph nodes in the included
lymphatic chains.

Reproductive: Normal appearance of the uterus and adnexal
structures.

Other: No abdominopelvic free fluid or free gas. No bowel containing
hernias. Small fat containing umbilical hernia.

Musculoskeletal: No acute osseous abnormality or suspicious osseous
lesion.
IMPRESSION: No acute intra-abdominal process. No explanation for the patient's
symptoms. Specifically, gallbladder is unremarkable by CT
evaluation.

Sigmoid colonic diverticulosis without evidence of acute
diverticulitis.

Small fat containing umbilical hernia.

## 2020-12-14 ENCOUNTER — Encounter (HOSPITAL_COMMUNITY): Payer: Self-pay

## 2020-12-14 ENCOUNTER — Ambulatory Visit (HOSPITAL_COMMUNITY)
Admission: RE | Admit: 2020-12-14 | Discharge: 2020-12-14 | Disposition: A | Discharge status: Home / Self Care | Payer: 59 | Source: Ambulatory Visit | Attending: Student | Admitting: Student

## 2020-12-14 ENCOUNTER — Other Ambulatory Visit: Payer: Self-pay

## 2020-12-14 VITALS — BP 129/93 | HR 82 | Temp 98.0°F | Resp 18

## 2020-12-14 DIAGNOSIS — N3001 Acute cystitis with hematuria: Secondary | ICD-10-CM

## 2020-12-14 LAB — POCT URINALYSIS DIPSTICK, ED / UC
Bilirubin Urine: NEGATIVE
Glucose, UA: NEGATIVE mg/dL
Ketones, ur: NEGATIVE mg/dL
Leukocytes,Ua: NEGATIVE
Nitrite: POSITIVE — AB
Protein, ur: NEGATIVE mg/dL
Specific Gravity, Urine: 1.02 (ref 1.005–1.030)
Urobilinogen, UA: 0.2 mg/dL (ref 0.0–1.0)
pH: 5.5 (ref 5.0–8.0)

## 2020-12-14 LAB — POC URINE PREG, ED: Preg Test, Ur: NEGATIVE

## 2020-12-14 MED ORDER — CEPHALEXIN 500 MG PO CAPS: 500.0000 mg | ORAL_CAPSULE | Freq: Four times a day (QID) | ORAL | 0 refills | Status: DC

## 2020-12-14 NOTE — ED Triage Notes (Signed)
Pt reports 2 days ago she has cloudy urine and flank pain before the cloudy urine.

## 2020-12-14 NOTE — Discharge Instructions (Signed)
-  Start the antibiotic: Keflex, 4x daily x5 days. You can take this with food if you have a sensitive stomach. -Drink plenty of fluids 

## 2020-12-14 NOTE — ED Provider Notes (Signed)
[facility]    CSN: [id number] Arrival date & time: 12/14/20  1510      History   Chief Complaint Chief Complaint  Patient presents with   cloudy urine   Flank Pain    HPI Martha Brady is a 35 y.o. female presenting with cloudy urine and flank pain. Medical history UTI, last 10/2020 treated at [facility]. Describes two days of cloudy urine and R sided flank pain that has resolved. Denies hematuria, dysuria, frequency, urgency, back pain, n/v/d/abd pain, fevers/chills, abdnormal vaginal discharge. States that [facility] initially treated her with macrobid which she did not tolerate, they then sent something else but she isn't sure what.  HPI  Past Medical History:  Diagnosis Date   Hiatal hernia     Patient Active Problem List   Diagnosis Date Noted   Flank pain 12/03/2013   Hematuria 12/03/2013    History reviewed. No pertinent surgical history.  OB History   No obstetric history on file.      Home Medications    Prior to Admission medications   Medication Sig Start Date End Date Taking? Authorizing Provider  cephALEXin (KEFLEX) 500 MG capsule Take 1 capsule (500 mg total) by mouth 4 (four) times daily. 12/14/20  Yes Rhys Martini, PA-C  acetaminophen (TYLENOL) 500 MG tablet Take 500 mg by mouth every 6 (six) hours as needed for mild pain or headache.    [provider]  famotidine (PEPCID) 20 MG tablet Take 1 tablet (20 mg total) by mouth 2 (two) times daily. 11/29/18   Fawze, Mina A, PA-C  ondansetron (ZOFRAN ODT) 4 MG disintegrating tablet Take 1 tablet (4 mg total) by mouth every 8 (eight) hours as needed. 11/29/18   Fawze, Mina A, PA-C  sucralfate (CARAFATE) 1 g tablet Take 1 tablet (1 g total) by mouth 4 (four) times daily -  with meals and at bedtime. 11/29/18   Jeanie Sewer, PA-C    Family History History reviewed. No pertinent family history.  Social History Social History   Tobacco Use   Smoking status: Never   Smokeless  tobacco: Never  Substance Use Topics   Alcohol use: No    Alcohol/week: 0.0 standard drinks   Drug use: No     Allergies   Patient has no known allergies.   Review of Systems Review of Systems  Constitutional:  Negative for appetite change, chills, diaphoresis and fever.  Respiratory:  Negative for shortness of breath.   Cardiovascular:  Negative for chest pain.  Gastrointestinal:  Negative for abdominal pain, blood in stool, constipation, diarrhea, nausea and vomiting.  Genitourinary:  Negative for decreased urine volume, difficulty urinating, dysuria, flank pain, frequency, genital sores, hematuria, urgency, vaginal discharge and vaginal pain.  Musculoskeletal:  Negative for back pain.  Neurological:  Negative for dizziness, weakness and light-headedness.  All other systems reviewed and are negative.   Physical Exam Triage Vital Signs ED Triage Vitals  Enc Vitals Group     BP      Pulse      Resp      Temp      Temp src      SpO2      Weight      Height      Head Circumference      Peak Flow      Pain Score      Pain Loc      Pain Edu?      Excl. in [place]?  No data found.  Updated Vital Signs BP (!) 129/93   Pulse 82   Temp 98 F (36.7 C)   Resp 18   LMP 12/06/2020   SpO2 98%   Visual Acuity Right Eye Distance:   Left Eye Distance:   Bilateral Distance:    Right Eye Near:   Left Eye Near:    Bilateral Near:     Physical Exam Vitals reviewed.  Constitutional:      General: She is not in acute distress.    Appearance: Normal appearance. She is not ill-appearing.  HENT:     Head: Normocephalic and atraumatic.     Mouth/Throat:     Mouth: Mucous membranes are moist.     Comments: Moist mucous membranes Eyes:     Extraocular Movements: Extraocular movements intact.     Pupils: Pupils are equal, round, and reactive to light.  Cardiovascular:     Rate and Rhythm: Normal rate and regular rhythm.     Heart sounds: Normal heart sounds.  Pulmonary:      Effort: Pulmonary effort is normal.     Breath sounds: Normal breath sounds. No wheezing, rhonchi or rales.  Abdominal:     General: Bowel sounds are normal. There is no distension.     Palpations: Abdomen is soft. There is no mass.     Tenderness: There is no abdominal tenderness. There is no right CVA tenderness, left CVA tenderness, guarding or rebound.  Skin:    General: Skin is warm.     Capillary Refill: Capillary refill takes less than 2 seconds.     Comments: Good skin turgor  Neurological:     General: No focal deficit present.     Mental Status: She is alert and oriented to person, place, and time.  Psychiatric:        Mood and Affect: Mood normal.        Behavior: Behavior normal.     UC Treatments / Results  Labs (all labs ordered are listed, but only abnormal results are displayed) Labs Reviewed  POCT URINALYSIS DIPSTICK, ED / UC - Abnormal; Notable for the following components:      Result Value   Hgb urine dipstick TRACE (*)    Nitrite POSITIVE (*)    All other components within normal limits  POC URINE PREG, ED    EKG   Radiology No results found.  Procedures Procedures (including critical care time)  Medications Ordered in UC Medications - No data to display  Initial Impression / Assessment and Plan / UC Course  I have reviewed the triage vital signs and the nursing notes.  Pertinent labs & imaging results that were available during my care of the patient were reviewed by me and considered in my medical decision making (see chart for details).     This patient is a very pleasant 35 y.o. year old female presenting with acute cystitis. Afebrile, nontachycardic, no reproducible abd pain or CVAT.  Last UTI 10/2020.  UA today with trace blood and trace nitrite; culture sent. U-preg negative.  Keflex sent as below. She does not tolerate macrobid.  ED return precautions discussed. Patient verbalizes understanding and agreement.    Final  Clinical Impressions(s) / UC Diagnoses   Final diagnoses:  Acute cystitis with hematuria     Discharge Instructions      -Start the antibiotic: Keflex, 4x daily x5 days. You can take this with food if you have a sensitive stomach. -Drink plenty of fluids  ED Prescriptions     Medication Sig Dispense Auth. Provider   cephALEXin (KEFLEX) 500 MG capsule Take 1 capsule (500 mg total) by mouth 4 (four) times daily. 20 capsule Hazel Sams, PA-C      PDMP not reviewed this encounter.   Hazel Sams, PA-C 12/14/20 1648

## 2021-02-04 ENCOUNTER — Encounter: Payer: 59 | Admitting: Family Medicine

## 2021-03-29 ENCOUNTER — Ambulatory Visit: Payer: Self-pay

## 2021-12-28 ENCOUNTER — Encounter: Payer: Self-pay | Admitting: Bariatrics

## 2021-12-28 ENCOUNTER — Ambulatory Visit (INDEPENDENT_AMBULATORY_CARE_PROVIDER_SITE_OTHER): Payer: 59 | Admitting: Bariatrics

## 2021-12-28 VITALS — BP 117/78 | HR 87 | Temp 98.0°F | Ht 64.0 in | Wt 183.0 lb

## 2021-12-28 DIAGNOSIS — K219 Gastro-esophageal reflux disease without esophagitis: Secondary | ICD-10-CM

## 2021-12-28 DIAGNOSIS — R5383 Other fatigue: Secondary | ICD-10-CM | POA: Diagnosis not present

## 2021-12-28 DIAGNOSIS — R7309 Other abnormal glucose: Secondary | ICD-10-CM

## 2021-12-28 DIAGNOSIS — R0602 Shortness of breath: Secondary | ICD-10-CM | POA: Diagnosis not present

## 2021-12-28 DIAGNOSIS — Z1331 Encounter for screening for depression: Secondary | ICD-10-CM | POA: Diagnosis not present

## 2021-12-28 DIAGNOSIS — E669 Obesity, unspecified: Secondary | ICD-10-CM

## 2021-12-28 DIAGNOSIS — Z6831 Body mass index (BMI) 31.0-31.9, adult: Secondary | ICD-10-CM

## 2021-12-28 DIAGNOSIS — E559 Vitamin D deficiency, unspecified: Secondary | ICD-10-CM | POA: Diagnosis not present

## 2021-12-28 DIAGNOSIS — Z0000 Encounter for general adult medical examination without abnormal findings: Secondary | ICD-10-CM

## 2021-12-28 DIAGNOSIS — G4720 Circadian rhythm sleep disorder, unspecified type: Secondary | ICD-10-CM

## 2021-12-29 ENCOUNTER — Encounter (INDEPENDENT_AMBULATORY_CARE_PROVIDER_SITE_OTHER): Payer: Self-pay | Admitting: Bariatrics

## 2021-12-29 DIAGNOSIS — R7303 Prediabetes: Secondary | ICD-10-CM | POA: Insufficient documentation

## 2021-12-29 LAB — HEMOGLOBIN A1C
Est. average glucose Bld gHb Est-mCnc: 126 mg/dL
Hgb A1c MFr Bld: 6 % — ABNORMAL HIGH (ref 4.8–5.6)

## 2021-12-29 LAB — COMPREHENSIVE METABOLIC PANEL
ALT: 11 IU/L (ref 0–32)
AST: 18 IU/L (ref 0–40)
Albumin/Globulin Ratio: 1.8 (ref 1.2–2.2)
Albumin: 4.9 g/dL (ref 3.9–4.9)
Alkaline Phosphatase: 118 IU/L (ref 44–121)
BUN/Creatinine Ratio: 14 (ref 9–23)
BUN: 11 mg/dL (ref 6–20)
Bilirubin Total: 0.3 mg/dL (ref 0.0–1.2)
CO2: 23 mmol/L (ref 20–29)
Calcium: 9.7 mg/dL (ref 8.7–10.2)
Chloride: 99 mmol/L (ref 96–106)
Creatinine, Ser: 0.78 mg/dL (ref 0.57–1.00)
Globulin, Total: 2.7 g/dL (ref 1.5–4.5)
Glucose: 85 mg/dL (ref 70–99)
Potassium: 4.4 mmol/L (ref 3.5–5.2)
Sodium: 137 mmol/L (ref 134–144)
Total Protein: 7.6 g/dL (ref 6.0–8.5)
eGFR: 102 mL/min/{1.73_m2} (ref >59)

## 2021-12-29 LAB — INSULIN, RANDOM: INSULIN: 5.8 u[IU]/mL (ref 2.6–24.9)

## 2021-12-29 LAB — TSH+T4F+T3FREE
Free T4: 0.96 ng/dL (ref 0.82–1.77)
T3, Free: 3.1 pg/mL (ref 2.0–4.4)
TSH: 1.32 u[IU]/mL (ref 0.450–4.500)

## 2021-12-29 LAB — VITAMIN D 25 HYDROXY (VIT D DEFICIENCY, FRACTURES): Vit D, 25-Hydroxy: 9.7 ng/mL — ABNORMAL LOW (ref 30.0–100.0)

## 2021-12-30 ENCOUNTER — Ambulatory Visit
Admission: RE | Admit: 2021-12-30 | Discharge: 2021-12-30 | Disposition: A | Discharge status: Home / Self Care | Payer: 59 | Source: Ambulatory Visit | Attending: Emergency Medicine | Admitting: Emergency Medicine

## 2021-12-30 VITALS — BP 124/86 | HR 83 | Temp 98.3°F | Resp 16

## 2021-12-30 DIAGNOSIS — N3001 Acute cystitis with hematuria: Secondary | ICD-10-CM

## 2021-12-30 LAB — POCT URINALYSIS DIP (MANUAL ENTRY)
Bilirubin, UA: NEGATIVE
Glucose, UA: NEGATIVE mg/dL
Ketones, POC UA: NEGATIVE mg/dL
Leukocytes, UA: NEGATIVE
Nitrite, UA: POSITIVE — AB
Protein Ur, POC: NEGATIVE mg/dL
Spec Grav, UA: 1.025 (ref 1.010–1.025)
Urobilinogen, UA: 0.2 E.U./dL
pH, UA: 6 (ref 5.0–8.0)

## 2021-12-30 LAB — POCT URINE PREGNANCY: Preg Test, Ur: NEGATIVE

## 2021-12-30 MED ORDER — SULFAMETHOXAZOLE-TRIMETHOPRIM 800-160 MG PO TABS: 1.0000 | ORAL_TABLET | Freq: Two times a day (BID) | ORAL | 0 refills | Status: AC

## 2021-12-30 NOTE — ED Triage Notes (Signed)
Pt c/o back pain that began about 3-4 days ago.   Home interventions: tylenol

## 2021-12-30 NOTE — ED Provider Notes (Signed)
[facility]    CSN: [id number] Arrival date & time: 12/30/21  1409    HISTORY   Chief Complaint  Patient presents with   Dysuria   Back Pain   HPI Martha Brady is a pleasant, 36 y.o. female who presents to urgent care today. Pt c/o right upper back pain that began about 3-4 days ago, states she is concerned that she has a urinary tract infection.  States she has tried Tylenol without meaningful relief of her symptoms.  Urine dip today revealed nitrites and small amount of hematuria.  Patient states she routinely squats when using public bathrooms.  Patient states she also has noticed that she gets more urinary tract infections when her partner does not wear a condom.  Patient states she has noticed that when she drinks something she now has to go the bathroom very soon after when normally she does not.  Patient denies abnormal odor of urine, burning with urination, sensation of incomplete emptying, suprapubic pain, perineal pain, flank pain, fever, chills, malaise, rigors, significant fatigue, abnormal vaginal discharge, vaginal itching, vaginal irritation, dyspareunia, genital lesion(s), known exposure to STD, and possible exposure to STD.     The history is provided by the patient.   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 No pertinent surgical history. OB History     Gravida  1   Para      Term      Preterm       AB      Living  1      SAB      IAB      Ectopic      Multiple      Live Births             Home Medications    Prior to Admission medications   Medication Sig Start Date End Date Taking? Authorizing Provider  sulfamethoxazole-trimethoprim (BACTRIM DS) 800-160 MG tablet Take 1 tablet by mouth 2 (two) times daily for 3 days. 12/30/21 01/02/22 Yes Lynden Oxford Scales, PA-C    Family History Family History  Problem Relation Age of Onset   Drug abuse Mother    Obesity Mother    Drug abuse Father    Social History Social History   Tobacco Use   Smoking status: Never   Smokeless tobacco: Never  Substance Use Topics   Alcohol use: No    Alcohol/week: 0.0 standard drinks of alcohol   Drug use: No   Allergies   Macrobid [nitrofurantoin]  Review of Systems Review of Systems Pertinent findings revealed after performing a 14 point review of systems has been noted in the history of present illness.  Physical Exam Triage Vital Signs ED Triage Vitals  Enc Vitals Group     BP 11/07/20 0827 (!) 147/82     Pulse Rate 11/07/20 0827 72     Resp 11/07/20 0827 18     Temp  11/07/20 0827 98.3 F (36.8 C)     Temp Source 11/07/20 0827 Oral     SpO2 11/07/20 0827 98 %     Weight --      Height --      Head Circumference --      Peak Flow --      Pain Score 11/07/20 0826 5     Pain Loc --      Pain Edu? --      Excl. in [place]? --   No data found.  Updated Vital Signs BP 124/86 (BP Location: Right Arm)   Pulse 83   Temp 98.3 F (36.8 C) (Oral)   Resp 16   LMP 12/09/2021 (Approximate)   SpO2 98%   Physical Exam Vitals and nursing note reviewed.  Constitutional:      General: She is not in acute distress.    Appearance: Normal appearance. She is not ill-appearing.  HENT:     Head: Normocephalic and atraumatic.  Eyes:     General: Lids are normal.        Right eye: No discharge.        Left eye: No discharge.     Extraocular Movements: Extraocular  movements intact.     Conjunctiva/sclera: Conjunctivae normal.     Right eye: Right conjunctiva is not injected.     Left eye: Left conjunctiva is not injected.  Neck:     Trachea: Trachea and phonation normal.  Cardiovascular:     Rate and Rhythm: Normal rate and regular rhythm.     Pulses: Normal pulses.     Heart sounds: Normal heart sounds. No murmur heard.    No friction rub. No gallop.  Pulmonary:     Effort: Pulmonary effort is normal. No accessory muscle usage, prolonged expiration or respiratory distress.     Breath sounds: Normal breath sounds. No stridor, decreased air movement or transmitted upper airway sounds. No decreased breath sounds, wheezing, rhonchi or rales.  Chest:     Chest wall: No tenderness.  Abdominal:     General: Abdomen is flat. Bowel sounds are normal. There is no distension.     Palpations: Abdomen is soft.     Tenderness: There is no abdominal tenderness. There is no right CVA tenderness or left CVA tenderness.     Hernia: No hernia is present.  Musculoskeletal:        General: Normal range of motion.     Cervical back: Normal range of motion and neck supple. Normal range of motion.  Lymphadenopathy:     Cervical: No cervical adenopathy.  Skin:    General: Skin is warm and dry.     Findings: No erythema or rash.  Neurological:     General: No focal deficit present.     Mental Status: She is alert and oriented to person, place, and time.  Psychiatric:        Mood and Affect: Mood normal.        Behavior: Behavior normal.     Visual Acuity Right Eye Distance:   Left Eye Distance:   Bilateral Distance:    Right Eye Near:   Left Eye Near:    Bilateral Near:     UC Couse / Diagnostics / Procedures:     Radiology No results found.  Procedures Procedures (including critical care time) EKG  Pending results:  Labs Reviewed  POCT URINALYSIS DIP (MANUAL ENTRY) - Abnormal; Notable for the following components:  Result Value    Clarity, UA cloudy (*)    Blood, UA small (*)    Nitrite, UA Positive (*)    All other components within normal limits  POCT URINE PREGNANCY    Medications Ordered in UC: Medications - No data to display  UC Diagnoses / Final Clinical Impressions(s)   I have reviewed the triage vital signs and the nursing notes.  Pertinent labs & imaging results that were available during my care of the patient were reviewed by me and considered in my medical decision making (see chart for details).    Final diagnoses:  Acute cystitis with hematuria   Urine dip today was positive.   Patient was advised to begin antibiotics now due to findings on urine dip. Patient was advised to begin antibiotics today due to having active symptoms of urinary tract infection.                    Patient was advised to take all doses exactly as prescribed.  Patient also advised of risks of worsening infection with incomplete antibiotic therapy. Aleve recommended for upper back pain on the right side. Return precautions advised.  Please see discharge instructions below for details of plan of care as provided to patient. ED Prescriptions     Medication Sig Dispense Auth. Provider   sulfamethoxazole-trimethoprim (BACTRIM DS) 800-160 MG tablet Take 1 tablet by mouth 2 (two) times daily for 3 days. 6 tablet Lynden Oxford Scales, PA-C      PDMP not reviewed this encounter.  Disposition Upon Discharge:  Condition: stable for discharge home  Patient presented with concern for an acute illness with associated systemic symptoms and significant discomfort requiring urgent management. In my opinion, this is a condition that a prudent lay person (someone who possesses an average knowledge of health and medicine) may potentially expect to result in complications if not addressed urgently such as respiratory distress, impairment of bodily function or dysfunction of bodily organs.   As such, the patient has been evaluated and  assessed, work-up was performed and treatment was provided in alignment with urgent care protocols and evidence based medicine.  Patient/parent/caregiver has been advised that the patient may require follow up for further testing and/or treatment if the symptoms continue in spite of treatment, as clinically indicated and appropriate.  Routine symptom specific, illness specific and/or disease specific instructions were discussed with the patient and/or caregiver at length.  Prevention strategies for avoiding STD exposure were also discussed.  The patient will follow up with their current PCP if and as advised. If the patient does not currently have a PCP we will assist them in obtaining one.   The patient may need specialty follow up if the symptoms continue, in spite of conservative treatment and management, for further workup, evaluation, consultation and treatment as clinically indicated and appropriate.  Patient/parent/caregiver verbalized understanding and agreement of plan as discussed.  All questions were addressed during visit.  Please see discharge instructions below for further details of plan.  Discharge Instructions:   Discharge Instructions      Common causes of urinary tract infections include but are not limited to holding your urine longer than you should, squatting instead of sitting down when urinating, sitting around in wet clothing such as a wet swimsuit or gym clothes too long, not emptying your bladder after having sexual intercourse, wiping from back to front instead of front to back after having a bowel movement.     The urinalysis  that we performed in the clinic today was abnormal.     You were advised to begin antibiotics today because your urinalysis is abnormal and you are having active symptoms of an acute lower urinary tract infection also known as cystitis.     Please pick up and begin taking your prescription for Bactrim DS (trimethoprim sulfamethoxazole) as soon  as possible.  Please take all doses exactly as prescribed.  You can take this medication with or without food.  This medication is safe to take with your other medications.  I have also sent a prescription for Pyridium which is an analgesic that will provide you with pain relief while you are waiting for your antibiotic to take full effect.   If you have not had complete resolution of your symptoms after completing treatment as prescribed, please return to urgent care for repeat evaluation or follow-up with your primary care provider.  Repeat urinalysis and urine culture may be indicated for more directed therapy.   Thank you for visiting urgent care today.  I appreciate the opportunity to participate in your care.     This office note has been dictated using Teaching laboratory technician.  Unfortunately, this method of dictation can sometimes lead to typographical or grammatical errors.  I apologize for your inconvenience in advance if this occurs.  Please do not hesitate to reach out to me if clarification is needed.       Theadora Rama Scales, [place] 12/30/21 [phone]

## 2021-12-30 NOTE — Discharge Instructions (Signed)
Common causes of urinary tract infections include but are not limited to holding your urine longer than you should, squatting instead of sitting down when urinating, sitting around in wet clothing such as a wet swimsuit or gym clothes too long, not emptying your bladder after having sexual intercourse, wiping from back to front instead of front to back after having a bowel movement.     The urinalysis that we performed in the clinic today was abnormal.     You were advised to begin antibiotics today because your urinalysis is abnormal and you are having active symptoms of an acute lower urinary tract infection also known as cystitis.     Please pick up and begin taking your prescription for Bactrim DS (trimethoprim sulfamethoxazole) as soon as possible.  Please take all doses exactly as prescribed.  You can take this medication with or without food.  This medication is safe to take with your other medications.  I have also sent a prescription for Pyridium which is an analgesic that will provide you with pain relief while you are waiting for your antibiotic to take full effect.   If you have not had complete resolution of your symptoms after completing treatment as prescribed, please return to urgent care for repeat evaluation or follow-up with your primary care provider.  Repeat urinalysis and urine culture may be indicated for more directed therapy.   Thank you for visiting urgent care today.  I appreciate the opportunity to participate in your care.

## 2022-01-06 NOTE — Progress Notes (Unsigned)
Chief Complaint:   OBESITY Martha Brady (MR# [id number]) is a 36 y.o. female who presents for evaluation and treatment of obesity and related comorbidities. Current BMI is Body mass index is 31.41 kg/m. Martha Brady has been struggling with her weight for many years and has been unsuccessful in either losing weight, maintaining weight loss, or reaching her healthy weight goal.  Martha Brady states that she does not like to cook. She craves mostly protein. She works third shift. Her goal weight weight is 135-140 lbs.   Martha Brady is currently in the action stage of change and ready to dedicate time achieving and maintaining a healthier weight. Martha Brady is interested in becoming our patient and working on intensive lifestyle modifications including (but not limited to) diet and exercise for weight loss.  Martha Brady's habits were reviewed today and are as follows: {MWM WT HABITS:23461}.  Depression Screen Martha Brady's Food and Mood (modified PHQ-9) score was 7.  Subjective:   1. Other fatigue Martha Brady admits to daytime somnolence and admits to waking up still tired. Patient has a history of symptoms of daytime fatigue, morning fatigue, and morning headache. Kyan generally gets 6 hours of sleep per night, and states that she has nightime awakenings. Snoring is present. Apneic episodes are not present. Epworth Sleepiness Score is 10.   2. SOB (shortness of breath) on exertion Martha Brady notes increasing shortness of breath with exercising and seems to be worsening over time with weight gain. She notes getting out of breath sooner with activity than she used to. This has not gotten worse recently. Martha Brady denies shortness of breath at rest or orthopnea.  3. Gastroesophageal reflux disease without esophagitis ***  4. Health care maintenance ***  5. Sleep disorder, circadian ***  6. Elevated glucose ***  7. Vitamin D deficiency ***  Assessment/Plan:   1. Other fatigue Martha Brady does  feel that her weight is causing her energy to be lower than it should be. Fatigue may be related to obesity, depression or many other causes. Labs will be ordered, and in the meanwhile, Risha will focus on self care including making healthy food choices, increasing physical activity and focusing on stress reduction.  - EKG 12-Lead - Hemoglobin A1c - Insulin, random - TSH+T4F+T3Free  2. SOB (shortness of breath) on exertion Martha Brady does feel that she gets out of breath more easily that she used to when she exercises. Martha Brady's shortness of breath appears to be obesity related and exercise induced. She has agreed to work on weight loss and gradually increase exercise to treat her exercise induced shortness of breath. Will continue to monitor closely.  3. Gastroesophageal reflux disease without esophagitis ***  4. Health care maintenance *** - Comprehensive metabolic panel  5. Sleep disorder, circadian ***  6. Elevated glucose *** - Comprehensive metabolic panel  7. Vitamin D deficiency *** - VITAMIN D 25 Hydroxy (Vit-D Deficiency, Fractures)  8. Depression screening Martha Brady had a positive depression screening. Depression is commonly associated with obesity and often results in emotional eating behaviors. We will monitor this closely and work on CBT to help improve the non-hunger eating patterns. Referral to Psychology may be required if no improvement is seen as she continues in our clinic.  9. Obesity, Current BMI 31.5 Martha Brady is currently in the action stage of change and her goal is to continue with weight loss efforts. I recommend Martha Brady begin the structured treatment plan as follows:  She has agreed to the Category 2 Plan.  Meal planning and mindful eating  were discussed. She is to work on her rest and increase her water intake.   Exercise goals: Increase exercise and she has weights at home.    Behavioral modification strategies: increasing lean protein intake,  decreasing simple carbohydrates, increasing vegetables, increasing water intake, decreasing eating out, no skipping meals, meal planning and cooking strategies, keeping healthy foods in the home, and planning for success.  She was informed of the importance of frequent follow-up visits to maximize her success with intensive lifestyle modifications for her multiple health conditions. She was informed we would discuss her lab results at her next visit unless there is a critical issue that needs to be addressed sooner. Martha Brady agreed to keep her next visit at the agreed upon time to discuss these results.  Objective:   Blood pressure 117/78, pulse 87, temperature 98 F (36.7 C), height 5\' 4"  (1.626 m), weight 183 lb (83 kg), SpO2 100 %. Body mass index is 31.41 kg/m.  EKG: Normal sinus rhythm, rate 90 BPM.  Indirect Calorimeter completed today shows a VO2 of 199 and a REE of 1368.  Her calculated basal metabolic rate is thus her basal metabolic rate is worse than expected.  General: Cooperative, alert, well developed, in no acute distress. HEENT: Conjunctivae and lids unremarkable. Cardiovascular: Regular rhythm.  Lungs: Normal work of breathing. Neurologic: No focal deficits.   Lab Results  Component Value Date   CREATININE 0.78 12/28/2021   BUN 11 12/28/2021   NA 137 12/28/2021   K 4.4 12/28/2021   CL 99 12/28/2021   CO2 23 12/28/2021   Lab Results  Component Value Date   ALT 11 12/28/2021   AST 18 12/28/2021   ALKPHOS 118 12/28/2021   BILITOT 0.3 12/28/2021   Lab Results  Component Value Date   HGBA1C 6.0 (H) 12/28/2021   Lab Results  Component Value Date   INSULIN 5.8 12/28/2021   Lab Results  Component Value Date   TSH 1.320 12/28/2021   No results found for: "CHOL", "HDL", "LDLCALC", "LDLDIRECT", "TRIG", "CHOLHDL" Lab Results  Component Value Date   WBC 10.1 11/29/2018   HGB 12.3 11/29/2018   HCT 37.5 11/29/2018   MCV 78.3 (L) 11/29/2018   PLT 354  11/29/2018   No results found for: "IRON", "TIBC", "FERRITIN"  Attestation Statements:   Reviewed by clinician on day of visit: allergies, medications, problem list, medical history, surgical history, family history, social history, and previous encounter notes.   12/01/2018, am acting as Trude Mcburney for Energy manager, DO.  I have reviewed the above documentation for accuracy and completeness, and I agree with the above. - ***

## 2022-01-14 ENCOUNTER — Ambulatory Visit (INDEPENDENT_AMBULATORY_CARE_PROVIDER_SITE_OTHER): Payer: 59 | Admitting: Bariatrics

## 2022-01-14 ENCOUNTER — Encounter: Payer: Self-pay | Admitting: Bariatrics

## 2022-01-14 VITALS — BP 124/89 | HR 89 | Temp 97.8°F | Ht 64.0 in | Wt 186.0 lb

## 2022-01-14 DIAGNOSIS — E559 Vitamin D deficiency, unspecified: Secondary | ICD-10-CM | POA: Diagnosis not present

## 2022-01-14 DIAGNOSIS — E669 Obesity, unspecified: Secondary | ICD-10-CM | POA: Diagnosis not present

## 2022-01-14 DIAGNOSIS — R632 Polyphagia: Secondary | ICD-10-CM

## 2022-01-14 DIAGNOSIS — R7303 Prediabetes: Secondary | ICD-10-CM

## 2022-01-14 DIAGNOSIS — Z6832 Body mass index (BMI) 32.0-32.9, adult: Secondary | ICD-10-CM

## 2022-01-14 MED ORDER — VITAMIN D (ERGOCALCIFEROL) 1.25 MG (50000 UNIT) PO CAPS: 50000.0000 [IU] | ORAL_CAPSULE | ORAL | 0 refills | Status: AC

## 2022-01-26 NOTE — Progress Notes (Signed)
Chief Complaint:   OBESITY Martha Brady is here to discuss her progress with her obesity treatment plan along with follow-up of her obesity related diagnoses. Martha Brady is on the Category 2 Plan and states she is following her eating plan approximately 50% of the time. Martha Brady states she has not been exercising.  Today's visit was #: 2 Starting weight: 183 lbs Starting date: 12/28/21 Today's weight: 186 lbs Today's date: 01/14/22 Total lbs lost to date: 0 Total lbs lost since last in-office visit: +3  Interim History: She is up 3 pounds since her last visit.  She sometimes skips breakfast.  She is doing well with her water.  Subjective:   1. Vitamin D deficiency Vitamin D-9.7.  2. Prediabetes A1c-6.0 Insulin- 5.8  3. Polyphagia A1c-6.0  Assessment/Plan:   1. Vitamin D deficiency Prescription: - Vitamin D, Ergocalciferol, (DRISDOL) 1.25 MG (50000 UNIT) CAPS capsule; Take 1 capsule (50,000 Units total) by mouth every 7 (seven) days.  Dispense: 5 capsule; Refill: 0  2. Prediabetes 1.  Handout for insulin resistance/prediabetes  3. Polyphagia 1.  Increase water, increase protein, increase fiber  Obesity, Current BMI 32.0 1.  Meal planning 2.  Reviewed labs 12/28/2021: CMP, vitamin D, hemoglobin A1c, insulin, and thyroid panel. 3.  Meal preparation  Martha Brady is currently in the action stage of change. As such, her goal is to continue with weight loss efforts. She has agreed to the Category 2 Plan.   Exercise goals: Will think about weight training/resistance.  Behavioral modification strategies: increasing lean protein intake, decreasing simple carbohydrates, increasing vegetables, increasing water intake, decreasing eating out, no skipping meals, meal planning and cooking strategies, keeping healthy foods in the home, and planning for success.  Martha Brady has agreed to follow-up with our clinic in 2 weeks. She was informed of the importance of frequent follow-up visits  to maximize her success with intensive lifestyle modifications for her multiple health conditions.   Objective:   Blood pressure 124/89, pulse 89, temperature 97.8 F (36.6 C), height 5\' 4"  (1.626 m), weight 186 lb (84.4 kg), last menstrual period 12/09/2021, SpO2 98 %. Body mass index is 31.93 kg/m.  General: Cooperative, alert, well developed, in no acute distress. HEENT: Conjunctivae and lids unremarkable. Cardiovascular: Regular rhythm.  Lungs: Normal work of breathing. Neurologic: No focal deficits.   Lab Results  Component Value Date   CREATININE 0.78 12/28/2021   BUN 11 12/28/2021   NA 137 12/28/2021   K 4.4 12/28/2021   CL 99 12/28/2021   CO2 23 12/28/2021   Lab Results  Component Value Date   ALT 11 12/28/2021   AST 18 12/28/2021   ALKPHOS 118 12/28/2021   BILITOT 0.3 12/28/2021   Lab Results  Component Value Date   HGBA1C 6.0 (H) 12/28/2021   Lab Results  Component Value Date   INSULIN 5.8 12/28/2021   Lab Results  Component Value Date   TSH 1.320 12/28/2021   No results found for: "CHOL", "HDL", "LDLCALC", "LDLDIRECT", "TRIG", "CHOLHDL" Lab Results  Component Value Date   VD25OH 9.7 (L) 12/28/2021   Lab Results  Component Value Date   WBC 10.1 11/29/2018   HGB 12.3 11/29/2018   HCT 37.5 11/29/2018   MCV 78.3 (L) 11/29/2018   PLT 354 11/29/2018   No results found for: "IRON", "TIBC", "FERRITIN"   Attestation Statements:   Reviewed by clinician on day of visit: allergies, medications, problem list, medical history, surgical history, family history, social history, and previous encounter notes.  I,  Dawn [employer], FNP-C, am acting as Location manager for Dr. Jearld Lesch.  I have reviewed the above documentation for accuracy and completeness, and I agree with the above. Jearld Lesch, DO

## 2022-01-27 ENCOUNTER — Telehealth: Payer: 59 | Admitting: Physician Assistant

## 2022-01-27 ENCOUNTER — Ambulatory Visit
Admission: RE | Admit: 2022-01-27 | Discharge: 2022-01-27 | Disposition: A | Discharge status: Home / Self Care | Payer: 59 | Source: Ambulatory Visit | Attending: Physician Assistant | Admitting: Physician Assistant

## 2022-01-27 VITALS — BP 135/90 | HR 80 | Temp 97.9°F | Resp 16

## 2022-01-27 DIAGNOSIS — N3001 Acute cystitis with hematuria: Secondary | ICD-10-CM

## 2022-01-27 DIAGNOSIS — N390 Urinary tract infection, site not specified: Secondary | ICD-10-CM

## 2022-01-27 LAB — POCT URINALYSIS DIP (MANUAL ENTRY)
Bilirubin, UA: NEGATIVE
Glucose, UA: NEGATIVE mg/dL
Ketones, POC UA: NEGATIVE mg/dL
Leukocytes, UA: NEGATIVE
Nitrite, UA: POSITIVE — AB
Protein Ur, POC: NEGATIVE mg/dL
Spec Grav, UA: 1.02 (ref 1.010–1.025)
Urobilinogen, UA: 0.2 E.U./dL
pH, UA: 7 (ref 5.0–8.0)

## 2022-01-27 MED ORDER — CEPHALEXIN 500 MG PO CAPS: 500.0000 mg | ORAL_CAPSULE | Freq: Three times a day (TID) | ORAL | 0 refills | Status: AC

## 2022-01-27 NOTE — Progress Notes (Signed)
Because of recent UTI and recurring symptoms, we need to make sure you get a urine culture so that the proper antibiotic is given and we can avoid future recurrence. As such, I feel your condition warrants further evaluation and I recommend that you be seen in a face to face visit.   NOTE: There will be NO CHARGE for this eVisit   If you are having a true medical emergency please call 911.      For an urgent face to face visit, [facility] has eight urgent care centers for your convenience:   NEW!! [facility] [place] [facility] [place] Get Driving Directions [phone] [address] [place], [facility] [place] [facility] [place] Get Driving Directions [phone] [place], [place] [address]   [facility]) Get Driving Directions [phone] [address], [place] [address]  [facility] [place] [facility]) Get Driving Directions [phone] [address] [place],  [place]  [address]  [facility] [place] [facility] at [address] [place] Get Driving Directions  [phone] [address] [place],  [place] [address]   [facility] Get Driving Directions [phone] [address] [place], [place], [place] [address]   [facility] Get Driving Directions  [phone] [address] [place], [place] [address]   [facility] [place] Get Driving Directions [phone] [address] [place],  [address]  Your MyChart E-visit questionnaire answers were reviewed by a board certified advanced clinical practitioner to complete your personal care plan based on your specific symptoms.  Thank you for using e-Visits.

## 2022-01-27 NOTE — ED Provider Notes (Signed)
[facility]    CSN: [id number] Arrival date & time: 01/27/22  1652      History   Chief Complaint Chief Complaint  Patient presents with   Urinary Frequency    Entered by patient    HPI Martha Brady is a 37 y.o. female.   Patient presents today with a 24-hour history of malodorous urine, urinary frequency, cloudy urine.  She has a history of recurrent UTIs with similar presentation.  She was last treated 12/30/2021 with Bactrim DS.  Reports that it took several days but after being on this medication for a while her symptoms did improve until recurrence today.  Culture was not obtained at this visit.  She has not seen a urologist,.  Denies history of nephrolithiasis.  She denies history of diabetes and does not take an SGLT2 inhibitor.  Denies additional antibiotics in the past 90 days besides the Bactrim that was prescribed.  She denies any abdominal pain, pelvic pain, vaginal symptoms, nausea, vomiting.  Denies any recent urogenital procedure, self-catheterization.    Past Medical History:  Diagnosis Date   Hiatal hernia     Patient Active Problem List   Diagnosis Date Noted   Prediabetes 12/29/2021   Other fatigue 12/28/2021   SOB (shortness of breath) on exertion 12/28/2021   Gastroesophageal reflux disease without esophagitis 12/28/2021   Health care maintenance 12/28/2021   Elevated glucose 12/28/2021   Sleep disorder, circadian 12/28/2021   Vitamin D deficiency 12/28/2021   Class 1 obesity with serious comorbidity and body mass index (BMI) of 31.0 to 31.9 in adult 12/28/2021   Flank pain 12/03/2013   Hematuria 12/03/2013    History reviewed. No pertinent surgical history.  OB History     Gravida  1   Para      Term      Preterm      AB      Living  1      SAB      IAB      Ectopic      Multiple      Live Births               Home Medications    Prior to Admission medications   Medication Sig Start Date End Date  Taking? Authorizing Provider  cephALEXin (KEFLEX) 500 MG capsule Take 1 capsule (500 mg total) by mouth 3 (three) times daily. 01/27/22  Yes Sybil Shrader K, PA-C  Vitamin D, Ergocalciferol, (DRISDOL) 1.25 MG (50000 UNIT) CAPS capsule Take 1 capsule (50,000 Units total) by mouth every 7 (seven) days. 01/14/22   Georgia Lopes, DO    Family History Family History  Problem Relation Age of Onset   Drug abuse Mother    Obesity Mother    Drug abuse Father     Social History Social History   Tobacco Use   Smoking status: Never   Smokeless tobacco: Never  Vaping Use   Vaping Use: Never used  Substance Use Topics   Alcohol use: No    Alcohol/week: 0.0 standard drinks of alcohol   Drug use: No     Allergies   Macrobid [nitrofurantoin]   Review of Systems Review of Systems  Constitutional:  Positive for activity change. Negative for appetite change, fatigue and fever.  Gastrointestinal:  Negative for abdominal pain, diarrhea, nausea and vomiting.  Genitourinary:  Positive for frequency. Negative for dysuria, flank pain, hematuria, urgency, vaginal bleeding, vaginal discharge and vaginal pain.  Musculoskeletal:  Positive  for back pain. Negative for arthralgias and myalgias.     Physical Exam Triage Vital Signs ED Triage Vitals  Enc Vitals Group     BP 01/27/22 1709 (!) 135/90     Pulse Rate 01/27/22 1709 80     Resp 01/27/22 1709 16     Temp 01/27/22 1709 97.9 F (36.6 C)     Temp Source 01/27/22 1709 Oral     SpO2 01/27/22 1709 98 %     Weight --      Height --      Head Circumference --      Peak Flow --      Pain Score 01/27/22 1708 0     Pain Loc --      Pain Edu? --      Excl. in GC? --    No data found.  Updated Vital Signs BP (!) 135/90 (BP Location: Right Arm)   Pulse 80   Temp 97.9 F (36.6 C) (Oral)   Resp 16   LMP 12/09/2021 (Approximate)   SpO2 98%   Visual Acuity Right Eye Distance:   Left Eye Distance:   Bilateral Distance:    Right Eye  Near:   Left Eye Near:    Bilateral Near:     Physical Exam Vitals reviewed.  Constitutional:      General: She is awake. She is not in acute distress.    Appearance: Normal appearance. She is well-developed. She is not ill-appearing.     Comments: Very pleasant female appears stated age no acute distress sitting comfortably in exam room  HENT:     Head: Normocephalic and atraumatic.  Cardiovascular:     Rate and Rhythm: Normal rate and regular rhythm.     Heart sounds: Normal heart sounds, S1 normal and S2 normal. No murmur heard. Pulmonary:     Effort: Pulmonary effort is normal.     Breath sounds: Normal breath sounds. No wheezing, rhonchi or rales.     Comments: Clear to auscultation bilaterally Abdominal:     General: Bowel sounds are normal.     Palpations: Abdomen is soft.     Tenderness: There is no abdominal tenderness. There is no right CVA tenderness, left CVA tenderness, guarding or rebound.     Comments: Benign abdominal exam  Psychiatric:        Behavior: Behavior is cooperative.      UC Treatments / Results  Labs (all labs ordered are listed, but only abnormal results are displayed) Labs Reviewed  POCT URINALYSIS DIP (MANUAL ENTRY) - Abnormal; Notable for the following components:      Result Value   Clarity, UA cloudy (*)    Blood, UA trace-intact (*)    Nitrite, UA Positive (*)    All other components within normal limits  URINE CULTURE    EKG   Radiology No results found.  Procedures Procedures (including critical care time)  Medications Ordered in UC Medications - No data to display  Initial Impression / Assessment and Plan / UC Course  I have reviewed the triage vital signs and the nursing notes.  Pertinent labs & imaging results that were available during my care of the patient were reviewed by me and considered in my medical decision making (see chart for details).     Patient is well-appearing, afebrile, nontoxic, nontachycardic.   UA shows evidence of infection with positive nitrites.  Patient was started on cephalexin 3 times daily for 5 days.  Will send  this for culture and we discussed that if this is abnormal or we need to arrange additional treatment based on sensitivities identified on culture we will contact her.  She is to rest and drink plenty of fluids.  Given her current symptoms recommended that she follow-up with urology and was given contact information for local provider with instruction to call to schedule an appointment.  Discussed that if she has any worsening or changing symptoms including abdominal pain, pelvic pain, fever, nausea, vomiting she needs to be seen immediately.  Strict return precautions given.  Work excuse note provided.  Final Clinical Impressions(s) / UC Diagnoses   Final diagnoses:  Acute cystitis with hematuria     Discharge Instructions      We are treating you for a UTI.  Please start cephalexin 3 times daily for 5 days.  We are sending your urine for culture and if we need to change the antibiotics based on these results we will contact you.  Make sure that you rest and drink plenty of fluid.  Since you have recurrent urinary tract infections I recommend you follow-up with urology.  Please call them to schedule an appointment.  If you develop any additional symptoms including abdominal pain, pelvic pain, fever, nausea, vomiting he should be seen immediately.     ED Prescriptions     Medication Sig Dispense Auth. Provider   cephALEXin (KEFLEX) 500 MG capsule Take 1 capsule (500 mg total) by mouth 3 (three) times daily. 15 capsule Karsin Pesta K, PA-C      PDMP not reviewed this encounter.   Terrilee Croak, PA-C 01/27/22 1732

## 2022-01-27 NOTE — Discharge Instructions (Signed)
We are treating you for a UTI.  Please start cephalexin 3 times daily for 5 days.  We are sending your urine for culture and if we need to change the antibiotics based on these results we will contact you.  Make sure that you rest and drink plenty of fluid.  Since you have recurrent urinary tract infections I recommend you follow-up with urology.  Please call them to schedule an appointment.  If you develop any additional symptoms including abdominal pain, pelvic pain, fever, nausea, vomiting he should be seen immediately.

## 2022-01-27 NOTE — ED Triage Notes (Signed)
Pt c/o foul smelling urine x today-states she was recently treated for UTI and did complete abx-NAD-steady gait

## 2022-01-28 ENCOUNTER — Encounter: Payer: Self-pay | Admitting: Bariatrics

## 2022-01-29 LAB — URINE CULTURE: Culture: >=100000 — AB

## 2022-03-22 ENCOUNTER — Ambulatory Visit: Payer: 59 | Admitting: Medical-Surgical

## 2023-05-05 ENCOUNTER — Ambulatory Visit
Admission: RE | Admit: 2023-05-05 | Discharge: 2023-05-05 | Disposition: A | Discharge status: Home / Self Care | Source: Ambulatory Visit | Attending: Physician Assistant | Admitting: Physician Assistant

## 2023-05-05 ENCOUNTER — Other Ambulatory Visit: Payer: Self-pay

## 2023-05-05 ENCOUNTER — Ambulatory Visit (INDEPENDENT_AMBULATORY_CARE_PROVIDER_SITE_OTHER): Admitting: Radiology

## 2023-05-05 VITALS — BP 118/73 | HR 83 | Temp 97.6°F | Resp 18 | Ht 64.0 in | Wt 205.0 lb

## 2023-05-05 DIAGNOSIS — S6010XA Contusion of unspecified finger with damage to nail, initial encounter: Secondary | ICD-10-CM

## 2023-05-05 DIAGNOSIS — S6992XA Unspecified injury of left wrist, hand and finger(s), initial encounter: Secondary | ICD-10-CM

## 2023-05-05 LAB — POCT URINE PREGNANCY: Preg Test, Ur: NEGATIVE

## 2023-05-05 MED ORDER — ACETAMINOPHEN 325 MG PO TABS
650.0000 mg | ORAL_TABLET | Freq: Once | ORAL | Status: AC
  Administered 2023-05-05: 650 mg via ORAL

## 2023-05-05 NOTE — Discharge Instructions (Signed)
 You were seen today for concerns of an injury to your left middle finger.  We are still waiting for the results of your x-ray but I do not see any obvious signs of a fracture or dislocation at this time.  We have supplied you with a splint to help stabilize the finger and we have buddy taped it to your ring finger to help prevent further discomfort.  We have also drained the blood underneath the nail of your left middle finger.  The nail may continue to drain blood over the next few days but if it starts to have drainage that looks like pus, swelling, increased pain or pressure you can return here or follow-up with your primary care provider.  We will keep you updated with your final radiology review of your x-ray once it is available and if we need to make any changes to your plan. At this time you can take Tylenol  and ibuprofen  as needed for pain management.  I suspect that you likely have soft tissue bruising and swelling which is causing the inability to bend your finger but this should start to resolve over time.  [facility] [address], [address], [place], [place] [address] [phone]  [facility] [address], [place], [place] [address]  [phone]

## 2023-05-05 NOTE — ED Triage Notes (Addendum)
 Pt presents with complaints of left middle finger injury last night at approximately 11 PM. Pt states she works at a [employer] and finger was smashed on accident. Pt currently rates her overall pain a 4/10. OTC Tylenol  taken with little relief. Last dose was yesterday night at 11:30 PM.

## 2023-05-05 NOTE — ED Provider Notes (Signed)
 [facility]    CSN: [id number] Arrival date & time: 05/05/23  1019      History   Chief Complaint Chief Complaint  Patient presents with   Finger Injury    Finger smashed into metal piece of equipment causing pain bruising and swelling . - Entered by patient    HPI Martha Brady is a 38 y.o. female.   HPI  Patient presents today with concerns for left middle finger injury from last night.  She reports that at work she struck/smashed her finger into a piece of equipment. She reports some decreased range of motion particularly at the distal aspect of the left middle finger as well as bruising beneath the nail and tenderness.  Past Medical History:  Diagnosis Date   Hiatal hernia     Patient Active Problem List   Diagnosis Date Noted   Prediabetes 12/29/2021   Other fatigue 12/28/2021   SOB (shortness of breath) on exertion 12/28/2021   Gastroesophageal reflux disease without esophagitis 12/28/2021   Health care maintenance 12/28/2021   Elevated glucose 12/28/2021   Sleep disorder, circadian 12/28/2021   Vitamin D  deficiency 12/28/2021   Class 1 obesity with serious comorbidity and body mass index (BMI) of 31.0 to 31.9 in adult 12/28/2021   Flank pain 12/03/2013   Hematuria 12/03/2013    History reviewed. No pertinent surgical history.  OB History     Gravida  1   Para      Term      Preterm      AB      Living  1      SAB      IAB      Ectopic      Multiple      Live Births               Home Medications    Prior to Admission medications   Medication Sig Start Date End Date Taking? Authorizing Provider  cephALEXin  (KEFLEX ) 500 MG capsule Take 1 capsule (500 mg total) by mouth 3 (three) times daily. 01/27/22   Raspet, Lakenya Riendeau K, PA-C  Vitamin D , Ergocalciferol , (DRISDOL ) 1.25 MG (50000 UNIT) CAPS capsule Take 1 capsule (50,000 Units total) by mouth every 7 (seven) days. 01/14/22   Lorayne Rocks, DO    Family History Family  History  Problem Relation Age of Onset   Drug abuse Mother    Obesity Mother    Drug abuse Father     Social History Social History   Tobacco Use   Smoking status: Never   Smokeless tobacco: Never  Vaping Use   Vaping status: Never Used  Substance Use Topics   Alcohol use: No    Alcohol/week: 0.0 standard drinks of alcohol   Drug use: No     Allergies   Macrobid [nitrofurantoin]   Review of Systems Review of Systems   Physical Exam Triage Vital Signs ED Triage Vitals  Encounter Vitals Group     BP 05/05/23 1025 118/73     Systolic BP Percentile --      Diastolic BP Percentile --      Pulse Rate 05/05/23 1025 83     Resp 05/05/23 1025 18     Temp 05/05/23 1025 97.6 F (36.4 C)     Temp Source 05/05/23 1025 Oral     SpO2 05/05/23 1025 97 %     Weight 05/05/23 1025 205 lb (93 kg)     Height 05/05/23 1025 5'  4" (1.626 m)     Head Circumference --      Peak Flow --      Pain Score 05/05/23 1033 4     Pain Loc --      Pain Education --      Exclude from Growth Chart --    No data found.  Updated Vital Signs BP 118/73 (BP Location: Right Arm)   Pulse 83   Temp 97.6 F (36.4 C) (Oral)   Resp 18   Ht 5\' 4"  (1.626 m)   Wt 205 lb (93 kg)   SpO2 97%   BMI 35.19 kg/m   Visual Acuity Right Eye Distance:   Left Eye Distance:   Bilateral Distance:    Right Eye Near:   Left Eye Near:    Bilateral Near:     Physical Exam Vitals reviewed.  Constitutional:      General: She is awake.     Appearance: Normal appearance. She is well-developed and well-groomed.  HENT:     Head: Normocephalic and atraumatic.  Cardiovascular:     Pulses:          Radial pulses are 2+ on the left side.  Pulmonary:     Effort: Pulmonary effort is normal.  Musculoskeletal:     Left hand: Tenderness and bony tenderness present. No swelling or deformity. Decreased range of motion. Normal strength. Normal capillary refill. Normal pulse.     Comments: Left middle finger does  not appear swollen but there is redness along the distal aspect.  She is able to bend the finger to the point of PIP joint but DIP joint is tender to palpation and she is not able to fully flex at this point.  She does have significant bruising and blood accumulation beneath the middle fingernail in this area is tender as well.  Finger strength is 5/5 in all fingers with finger abduction  Skin:    General: Skin is warm and dry.  Neurological:     General: No focal deficit present.     Mental Status: She is alert and oriented to person, place, and time.  Psychiatric:        Mood and Affect: Mood normal.        Behavior: Behavior normal. Behavior is cooperative.        Thought Content: Thought content normal.        Judgment: Judgment normal.      UC Treatments / Results  Labs (all labs ordered are listed, but only abnormal results are displayed) Labs Reviewed  POCT URINE PREGNANCY    EKG   Radiology DG Finger Middle Left Result Date: 05/05/2023 CLINICAL DATA:  Finger injury EXAM: LEFT MIDDLE FINGER 2+V COMPARISON:  None Available. FINDINGS: There is no evidence of fracture or dislocation. There is no evidence of arthropathy or other focal bone abnormality. Soft tissues are unremarkable. IMPRESSION: Negative. Electronically Signed   By: Fredrich Jefferson M.D.   On: 05/05/2023 13:17    Procedures Procedures (including critical care time)  Medications Ordered in UC Medications  acetaminophen  (TYLENOL ) tablet 650 mg (650 mg Oral Given 05/05/23 1142)    Initial Impression / Assessment and Plan / UC Course  I have reviewed the triage vital signs and the nursing notes.  Pertinent labs & imaging results that were available during my care of the patient were reviewed by me and considered in my medical decision making (see chart for details).    Patient is amenable to trephination  of left middle finger nail to assist with discomfort and potentially reduce risk of nail loss.  Procedure was  explained to patient and she voiced consent.  Discussed with patient that trephination may cause pain, nail discoloration, may not completely prevent loss of the nail, cosmetic disfigurement, risk of infection.  Patient again voiced consent to procedure.   Final Clinical Impressions(s) / UC Diagnoses   Final diagnoses:  Finger injury, left, initial encounter  Subungual hematoma of digit of hand, initial encounter   Patient presents today with concerns for left middle finger injury that occurred after hitting her finger/smashing her finger in equipment at work.  She does report tenderness along the distal aspect of the finger and there is notable subungual hematoma present.  Patient was amenable to trephination today to help relieve discomfort.  I discussed risk as well as benefits of this procedure and patient provided verbal consent.  Patient tolerated trephination procedure well and there was notable bloody drainage from the hematoma but no signs of purulence.  Imaging by my interpretation did not show obvious signs of a fracture or dislocation.  Patient was sent home with buddy taping and static splint to help provide stability which was then wrapped in an Ace bandage.  Radiology review demonstrated negative x-ray.  At this time recommend continued use of splint to help with stability but patient is free to discontinue this once comfortable.  At this time suspect soft tissue injury and bruising.  I cannot completely rule out potential tendon damage given the fact that she is not able to fully flex the finger but this could be due to swelling.  Recommend follow-up with orthopedics should this continue for longer than 7 days.  Recommend Tylenol  and ibuprofen  as needed for pain management.  Follow-up as needed     Discharge Instructions      You were seen today for concerns of an injury to your left middle finger.  We are still waiting for the results of your x-ray but I do not see any obvious signs  of a fracture or dislocation at this time.  We have supplied you with a splint to help stabilize the finger and we have buddy taped it to your ring finger to help prevent further discomfort.  We have also drained the blood underneath the nail of your left middle finger.  The nail may continue to drain blood over the next few days but if it starts to have drainage that looks like pus, swelling, increased pain or pressure you can return here or follow-up with your primary care provider.  We will keep you updated with your final radiology review of your x-ray once it is available and if we need to make any changes to your plan. At this time you can take Tylenol  and ibuprofen  as needed for pain management.  I suspect that you likely have soft tissue bruising and swelling which is causing the inability to bend your finger but this should start to resolve over time.  [facility] [address], [place], [place] [address] [phone]  [facility] [address] [place], [place] [address]  [phone]       ED Prescriptions   None    PDMP not reviewed this encounter.   Jerona Mooring, PA-C 05/05/23 1331

## 2023-08-22 ENCOUNTER — Other Ambulatory Visit: Payer: Self-pay | Admitting: Medical Genetics

## 2023-10-22 ENCOUNTER — Other Ambulatory Visit: Payer: Self-pay | Admitting: Medical Genetics

## 2023-10-22 DIAGNOSIS — Z006 Encounter for examination for normal comparison and control in clinical research program: Secondary | ICD-10-CM
